# Patient Record
Sex: Female | Born: 1950 | Race: Black or African American | Hispanic: No | Marital: Single | Smoking: Current every day smoker
Health system: Southern US, Community
[De-identification: ages and names within clinical notes are randomized; demographics above are authoritative.]

## PROBLEM LIST (undated history)

## (undated) DIAGNOSIS — R000 Tachycardia, unspecified: Secondary | ICD-10-CM

## (undated) DIAGNOSIS — I10 Essential (primary) hypertension: Secondary | ICD-10-CM

## (undated) DIAGNOSIS — C50919 Malignant neoplasm of unspecified site of unspecified female breast: Secondary | ICD-10-CM

## (undated) DIAGNOSIS — E119 Type 2 diabetes mellitus without complications: Secondary | ICD-10-CM

## (undated) HISTORY — PX: OTHER SURGICAL HISTORY: SHX169

---

## 2009-01-15 ENCOUNTER — Ambulatory Visit: Payer: Self-pay | Admitting: Family Medicine

## 2010-04-27 ENCOUNTER — Emergency Department: Payer: Self-pay | Admitting: Emergency Medicine

## 2011-01-27 ENCOUNTER — Emergency Department: Payer: Self-pay | Admitting: Emergency Medicine

## 2011-02-17 ENCOUNTER — Emergency Department: Payer: Self-pay | Admitting: *Deleted

## 2011-03-18 ENCOUNTER — Emergency Department: Payer: Self-pay | Admitting: *Deleted

## 2011-03-21 ENCOUNTER — Other Ambulatory Visit: Payer: Self-pay | Admitting: General Practice

## 2012-05-04 ENCOUNTER — Emergency Department: Payer: Self-pay | Admitting: Emergency Medicine

## 2012-05-15 ENCOUNTER — Ambulatory Visit: Payer: Self-pay | Admitting: Orthopedic Surgery

## 2013-04-01 ENCOUNTER — Emergency Department: Payer: Self-pay

## 2013-04-01 LAB — CBC
HCT: 41.1 % (ref 35.0–47.0)
HGB: 14 g/dL (ref 12.0–16.0)
MCHC: 34.1 g/dL (ref 32.0–36.0)
Platelet: 230 10*3/uL (ref 150–440)
WBC: 8.8 10*3/uL (ref 3.6–11.0)

## 2013-04-01 LAB — COMPREHENSIVE METABOLIC PANEL
Albumin: 4.1 g/dL (ref 3.4–5.0)
Anion Gap: 7 (ref 7–16)
BUN: 8 mg/dL (ref 7–18)
Calcium, Total: 9.3 mg/dL (ref 8.5–10.1)
Chloride: 103 mmol/L (ref 98–107)
Creatinine: 0.73 mg/dL (ref 0.60–1.30)
EGFR (African American): >60
Osmolality: 270 (ref 275–301)
SGOT(AST): 47 U/L — ABNORMAL HIGH (ref 15–37)
Sodium: 134 mmol/L — ABNORMAL LOW (ref 136–145)
Total Protein: 7.7 g/dL (ref 6.4–8.2)

## 2013-04-01 LAB — TROPONIN I: Troponin-I: <0.02 ng/mL

## 2013-04-08 ENCOUNTER — Emergency Department: Payer: Self-pay | Admitting: Emergency Medicine

## 2013-12-29 ENCOUNTER — Emergency Department: Payer: Self-pay | Admitting: Emergency Medicine

## 2013-12-29 LAB — CBC WITH DIFFERENTIAL/PLATELET
BASOS ABS: 0.2 10*3/uL — AB (ref 0.0–0.1)
Basophil %: 1.1 %
EOS PCT: 2 %
Eosinophil #: 0.3 10*3/uL (ref 0.0–0.7)
HCT: 43.5 % (ref 35.0–47.0)
HGB: 13.8 g/dL (ref 12.0–16.0)
LYMPHS ABS: 2.4 10*3/uL (ref 1.0–3.6)
LYMPHS PCT: 16 %
MCH: 28.7 pg (ref 26.0–34.0)
MCHC: 31.7 g/dL — ABNORMAL LOW (ref 32.0–36.0)
MCV: 91 fL (ref 80–100)
Monocyte #: 1.1 x10 3/mm — ABNORMAL HIGH (ref 0.2–0.9)
Monocyte %: 7.5 %
NEUTROS ABS: 11.1 10*3/uL — AB (ref 1.4–6.5)
NEUTROS PCT: 73.4 %
PLATELETS: 217 10*3/uL (ref 150–440)
RBC: 4.8 10*6/uL (ref 3.80–5.20)
RDW: 15.8 % — ABNORMAL HIGH (ref 11.5–14.5)
WBC: 15.2 10*3/uL — AB (ref 3.6–11.0)

## 2013-12-29 LAB — COMPREHENSIVE METABOLIC PANEL
ALBUMIN: 3.5 g/dL (ref 3.4–5.0)
ALK PHOS: 50 U/L
ALT: 28 U/L
ANION GAP: 9 (ref 7–16)
AST: 28 U/L (ref 15–37)
BUN: 10 mg/dL (ref 7–18)
Bilirubin,Total: 0.4 mg/dL (ref 0.2–1.0)
CALCIUM: 8.2 mg/dL — AB (ref 8.5–10.1)
CHLORIDE: 109 mmol/L — AB (ref 98–107)
CREATININE: 0.8 mg/dL (ref 0.60–1.30)
Co2: 22 mmol/L (ref 21–32)
GLUCOSE: 177 mg/dL — AB (ref 65–99)
OSMOLALITY: 283 (ref 275–301)
Potassium: 3.8 mmol/L (ref 3.5–5.1)
Sodium: 140 mmol/L (ref 136–145)
Total Protein: 6.8 g/dL (ref 6.4–8.2)

## 2013-12-29 LAB — URINALYSIS, COMPLETE
Bilirubin,UR: NEGATIVE
Blood: NEGATIVE
Glucose,UR: 150 mg/dL (ref 0–75)
LEUKOCYTE ESTERASE: NEGATIVE
Nitrite: NEGATIVE
Ph: 5 (ref 4.5–8.0)
Protein: NEGATIVE
SPECIFIC GRAVITY: 1.025 (ref 1.003–1.030)
Squamous Epithelial: <1
WBC UR: 1 /HPF (ref 0–5)

## 2013-12-29 LAB — LIPASE, BLOOD: Lipase: 156 U/L (ref 73–393)

## 2018-08-14 ENCOUNTER — Emergency Department
Admission: EM | Admit: 2018-08-14 | Discharge: 2018-08-15 | Disposition: A | Discharge status: Home / Self Care | Payer: Medicare Other | Attending: Emergency Medicine | Admitting: Emergency Medicine

## 2018-08-14 ENCOUNTER — Other Ambulatory Visit: Payer: Self-pay

## 2018-08-14 ENCOUNTER — Emergency Department: Payer: Medicare Other

## 2018-08-14 DIAGNOSIS — E119 Type 2 diabetes mellitus without complications: Secondary | ICD-10-CM | POA: Diagnosis not present

## 2018-08-14 DIAGNOSIS — H5789 Other specified disorders of eye and adnexa: Secondary | ICD-10-CM | POA: Diagnosis not present

## 2018-08-14 DIAGNOSIS — R079 Chest pain, unspecified: Secondary | ICD-10-CM | POA: Diagnosis not present

## 2018-08-14 DIAGNOSIS — Z79899 Other long term (current) drug therapy: Secondary | ICD-10-CM | POA: Diagnosis not present

## 2018-08-14 DIAGNOSIS — R002 Palpitations: Secondary | ICD-10-CM

## 2018-08-14 HISTORY — DX: Type 2 diabetes mellitus without complications: E11.9

## 2018-08-14 HISTORY — DX: Tachycardia, unspecified: R00.0

## 2018-08-14 LAB — CBC
HEMATOCRIT: 46.1 % — AB (ref 36.0–46.0)
Hemoglobin: 15.3 g/dL — ABNORMAL HIGH (ref 12.0–15.0)
MCH: 28.8 pg (ref 26.0–34.0)
MCHC: 33.2 g/dL (ref 30.0–36.0)
MCV: 86.7 fL (ref 80.0–100.0)
Platelets: 234 10*3/uL (ref 150–400)
RBC: 5.32 MIL/uL — ABNORMAL HIGH (ref 3.87–5.11)
RDW: 14.7 % (ref 11.5–15.5)
WBC: 9.3 10*3/uL (ref 4.0–10.5)
nRBC: 0 % (ref 0.0–0.2)

## 2018-08-14 LAB — BASIC METABOLIC PANEL
Anion gap: 14 (ref 5–15)
BUN: 17 mg/dL (ref 8–23)
CHLORIDE: 103 mmol/L (ref 98–111)
CO2: 21 mmol/L — ABNORMAL LOW (ref 22–32)
CREATININE: 1.05 mg/dL — AB (ref 0.44–1.00)
Calcium: 10.1 mg/dL (ref 8.9–10.3)
GFR calc Af Amer: >60 mL/min (ref >60)
GFR calc non Af Amer: 56 mL/min — ABNORMAL LOW (ref >60)
Glucose, Bld: 294 mg/dL — ABNORMAL HIGH (ref 70–99)
Potassium: 4.1 mmol/L (ref 3.5–5.1)
Sodium: 138 mmol/L (ref 135–145)

## 2018-08-14 LAB — TROPONIN I: Troponin I: <0.03 ng/mL (ref <0.03)

## 2018-08-14 MED ORDER — SODIUM CHLORIDE 0.9 % IV SOLN: Freq: Once | INTRAVENOUS | Status: DC

## 2018-08-14 MED ORDER — LORAZEPAM 2 MG/ML IJ SOLN
0.5000 mg | Freq: Once | INTRAMUSCULAR | Status: AC
  Administered 2018-08-14: 0.5 mg via INTRAVENOUS
  Filled 2018-08-14: qty 1

## 2018-08-14 NOTE — ED Triage Notes (Signed)
Pt to ED via ems from home, pt states she started feeling palpations this morning. Pt initially did not have chest pain but began having chest pain while in the ambulance. Pt states it only lasted a minute and denies any pain, shortness of breath now. Pt has hx of ST and take metoprolol for it. NAD. VSS.

## 2018-08-14 NOTE — ED Provider Notes (Signed)
[facility] Emergency Department Provider Note       Time seen: ----------------------------------------- 10:02 PM on 08/14/2018 -----------------------------------------   I have reviewed the triage vital signs and the nursing notes.  HISTORY   Chief Complaint Chest Pain    HPI Alyssa Summers is a 68 y.o. female with a history of diabetes and tachycardia who presents to the ED for palpitations and chest pain.  Patient reports she has been having palpitations all day and she had some chest pain while in the ambulance on the way to the ER.  She reports taking her medications as she is prescribed.  She has had some elevated blood sugars.  She does report some itchy and watery eyes but no specific respiratory symptoms.  No past medical history on file.  There are no active problems to display for this patient.  Allergies Patient has no allergy information on record.  Social History Social History   Tobacco Use  . Smoking status: Not on file  Substance Use Topics  . Alcohol use: Not on file  . Drug use: Not on file   Review of Systems Constitutional: Negative for fever. Cardiovascular: Positive for palpitations, chest pain Respiratory: Negative for shortness of breath. Gastrointestinal: Negative for abdominal pain, vomiting and diarrhea. Skin: Negative for rash. Neurological: Negative for headaches, focal weakness or numbness.  All systems negative/normal/unremarkable except as stated in the HPI  ____________________________________________   PHYSICAL EXAM:  VITAL SIGNS: ED Triage Vitals [08/14/18 2201]  Enc Vitals Group     BP (!) 133/99     Pulse Rate 95     Resp      Temp      Temp src      SpO2 97 %     Weight      Height      Head Circumference      Peak Flow      Pain Score      Pain Loc      Pain Edu?      Excl. in [place]?    Constitutional: Alert and oriented.  Anxious, no distress Eyes: Conjunctivae are normal. Normal  extraocular movements. ENT      Head: Normocephalic and atraumatic.      Nose: No congestion/rhinnorhea.      Mouth/Throat: Mucous membranes are moist.      Neck: No stridor. Cardiovascular: Normal rate, regular rhythm. No murmurs, rubs, or gallops. Respiratory: Normal respiratory effort without tachypnea nor retractions. Breath sounds are clear and equal bilaterally. No wheezes/rales/rhonchi. Gastrointestinal: Soft and nontender. Normal bowel sounds Musculoskeletal: Nontender with normal range of motion in extremities. No lower extremity tenderness nor edema. Neurologic:  Normal speech and language. No gross focal neurologic deficits are appreciated.  Skin:  Skin is warm, dry and intact. No rash noted. Psychiatric: Mood and affect are normal. Speech and behavior are normal.  ____________________________________________  EKG: Interpreted by me.  Sinus rhythm the rate of 95 bpm, PVC, right bundle branch block, normal axis  ____________________________________________  ED COURSE:  As part of my medical decision making, I reviewed the following data within the [place] History obtained from family if available, nursing notes, old chart and ekg, as well as notes from prior ED visits. Patient presented for palpitations, we will assess with labs and imaging as indicated at this time.   Procedures Georgina Krist was evaluated in Emergency Department on 08/14/2018 for the symptoms described in the history of present illness. She was evaluated  in the context of the global COVID-19 pandemic, which necessitated consideration that the patient might be at risk for infection with the SARS-CoV-2 virus that causes COVID-19. Institutional protocols and algorithms that pertain to the evaluation of patients at risk for COVID-19 are in a state of rapid change based on information released by regulatory bodies including the CDC and federal and state organizations. These policies and algorithms  were followed during the patient's care in the ED.  ____________________________________________   LABS (pertinent positives/negatives)  Labs Reviewed  BASIC METABOLIC PANEL - Abnormal; Notable for the following components:      Result Value   CO2 21 (*)    Glucose, Bld 294 (*)    Creatinine, Ser 1.05 (*)    GFR calc non Af Amer 56 (*)    All other components within normal limits  CBC - Abnormal; Notable for the following components:   RBC 5.32 (*)    Hemoglobin 15.3 (*)    HCT 46.1 (*)    All other components within normal limits  TROPONIN I    RADIOLOGY  Chest x-ray IMPRESSION: Mild bronchial thickening which may be bronchitis, asthma, or smoking related lung disease. ____________________________________________   DIFFERENTIAL DIAGNOSIS   Palpitations,, arrhythmia, anxiety, tachycardia, medication side effect  FINAL ASSESSMENT AND PLAN  Palpitations, hyperglycemia   Plan: The patient had presented for palpitations. Patient's labs did indicate some hyperglycemia but no other significant acute process.  She was given IV fluids as well as IV Ativan patient's imaging was reassuring.  She is cleared for outpatient follow-up.   Laurence Aly, MD    Note: This note was generated in part or whole with voice recognition software. Voice recognition is usually quite accurate but there are transcription errors that can and very often do occur. I apologize for any typographical errors that were not detected and corrected.     Earleen Newport, MD 08/14/18 [phone]

## 2018-11-09 ENCOUNTER — Other Ambulatory Visit: Payer: Self-pay | Admitting: Family Medicine

## 2018-11-09 ENCOUNTER — Other Ambulatory Visit (HOSPITAL_COMMUNITY): Payer: Self-pay | Admitting: Family Medicine

## 2018-11-09 ENCOUNTER — Other Ambulatory Visit (HOSPITAL_COMMUNITY): Payer: Self-pay | Admitting: Physical Medicine and Rehabilitation

## 2018-11-09 ENCOUNTER — Other Ambulatory Visit: Payer: Self-pay

## 2018-11-09 ENCOUNTER — Ambulatory Visit
Admission: RE | Admit: 2018-11-09 | Discharge: 2018-11-09 | Disposition: A | Discharge status: Home / Self Care | Payer: Medicare Other | Source: Ambulatory Visit | Attending: Family Medicine | Admitting: Family Medicine

## 2018-11-09 DIAGNOSIS — M5416 Radiculopathy, lumbar region: Secondary | ICD-10-CM

## 2018-11-09 DIAGNOSIS — M5136 Other intervertebral disc degeneration, lumbar region: Secondary | ICD-10-CM

## 2018-11-25 ENCOUNTER — Emergency Department
Admission: EM | Admit: 2018-11-25 | Discharge: 2018-11-25 | Disposition: A | Discharge status: Home / Self Care | Payer: Medicare Other | Attending: Emergency Medicine | Admitting: Emergency Medicine

## 2018-11-25 ENCOUNTER — Other Ambulatory Visit: Payer: Self-pay

## 2018-11-25 DIAGNOSIS — M5432 Sciatica, left side: Secondary | ICD-10-CM | POA: Diagnosis not present

## 2018-11-25 DIAGNOSIS — F17210 Nicotine dependence, cigarettes, uncomplicated: Secondary | ICD-10-CM | POA: Diagnosis not present

## 2018-11-25 DIAGNOSIS — R200 Anesthesia of skin: Secondary | ICD-10-CM | POA: Diagnosis not present

## 2018-11-25 DIAGNOSIS — M549 Dorsalgia, unspecified: Secondary | ICD-10-CM | POA: Diagnosis present

## 2018-11-25 HISTORY — DX: Essential (primary) hypertension: I10

## 2018-11-25 MED ORDER — OXYCODONE HCL 5 MG PO TABS: 5.0000 mg | ORAL_TABLET | Freq: Four times a day (QID) | ORAL | 0 refills | Status: DC | PRN

## 2018-11-25 MED ORDER — OXYCODONE-ACETAMINOPHEN 5-325 MG PO TABS
2.0000 | ORAL_TABLET | Freq: Once | ORAL | Status: AC
  Administered 2018-11-25: 22:00:00 2 via ORAL
  Filled 2018-11-25: qty 2

## 2018-11-25 NOTE — ED Triage Notes (Signed)
Patient coming [facility] from home for back pain X 1 month. Patient has seen orthopedist, given oxycodone and steroids with no relief of pain. EMS vitals: 148/88, HR 110, CBG 186, 97% on RA.

## 2018-11-25 NOTE — ED Triage Notes (Addendum)
Pt comes via [facility] from home with c/o back pain. Pt states it has been ongoing for over a month and has not gotten better. Pt states it starts in her lower back and progresses to her left leg. Pt states she has had steroid injections last Friday with no relief.  Pt also states no relief with prescribed medications. Pt tearful in triage.  Pt states she was just sent to orthopedic MD today and nothing was done.

## 2018-11-25 NOTE — Discharge Instructions (Addendum)
Please follow-up with physical therapy, start muscle relaxer and increase gabapentin to 1 tab p.o. 3 times daily for the next 3 days then you can increase to 1 tab in the morning, 1 tab at noon and 2 tabs at night.  You may take oxycodone 5 mg every 6 hours as needed for severe pain but try to stick to 2 tablets daily as needed.  Continue with topical Voltaren gel.  Return to the ER for any severe increasing pain, worsening symptoms or urgent changes in your health.

## 2018-11-25 NOTE — ED Provider Notes (Addendum)
[facility] EMERGENCY DEPARTMENT Provider Note   CSN: [id number] Arrival date & time: 11/25/18  2003     History   Chief Complaint Chief Complaint  Patient presents with  . Back Pain    HPI Alyssa Summers is a 68 y.o. female presents to the emergency department for evaluation of severe back pain rating down her left leg.  Symptoms are constant, increased with standing.  Symptoms been ongoing for greater than 1 month.  She has had a recent MRI lumbar spine showing possible left L4 nerve impingement followed by lumbar ESI injections with no improvement.  She saw neurosurgery today and was recommended to start outpatient PT.  He added a muscle laxer which patient has not taken yet.  Neurosurgery did not feels that patient was a candidate for surgery at this time.  Physiatry has been prescribing patient gabapentin and oxycodone.  She is currently taking gabapentin 2 tabs daily along with oxycodone 5 mg twice daily.  She states his medications are not helping with her pain.  Patient denies any loss of bowel or bladder symptoms.  No fevers.  She is ambulatory with a cane.  No new trauma or injury.     HPI  Past Medical History:  Diagnosis Date  . Diabetes mellitus without complication ([place])   . Hypertension   . Tachycardia     There are no active problems to display for this patient.   History reviewed. No pertinent surgical history.   OB History   No obstetric history on file.      Home Medications    Prior to Admission medications   Medication Sig Start Date End Date Taking? Authorizing Provider  oxyCODONE (ROXICODONE) 5 MG immediate release tablet Take 1 tablet (5 mg total) by mouth every 6 (six) hours as needed. 11/25/18 11/25/19  Duanne Guess, PA-C    Family History No family history on file.  Social History Social History   Tobacco Use  . Smoking status: Current Every Day Smoker    Packs/day: 0.50    Types: Cigarettes  . Smokeless tobacco:  Never Used  Substance Use Topics  . Alcohol use: Yes  . Drug use: Never     Allergies   Glipizide   Review of Systems Review of Systems  Constitutional: Negative for activity change and fever.  Eyes: Negative for pain and visual disturbance.  Respiratory: Negative for shortness of breath.   Cardiovascular: Negative for chest pain and leg swelling.  Gastrointestinal: Negative for abdominal pain.  Genitourinary: Negative for flank pain and pelvic pain.  Musculoskeletal: Positive for back pain. Negative for arthralgias, gait problem, joint swelling, myalgias, neck pain and neck stiffness.  Skin: Negative for wound.  Neurological: Positive for numbness. Negative for dizziness, syncope, weakness, light-headedness and headaches.  Psychiatric/Behavioral: Negative for confusion and decreased concentration.     Physical Exam Updated Vital Signs BP 116/80   Pulse (!) 110   Temp 100.2 F (37.9 C) (Oral)   Resp 18   Ht 5\' 8"  (1.727 m)   Wt 77.6 kg   SpO2 98%   BMI 26.00 kg/m   Physical Exam Constitutional:      Appearance: She is well-developed.  HENT:     Head: Normocephalic and atraumatic.     Right Ear: External ear normal.     Left Ear: External ear normal.     Nose: Nose normal.  Eyes:     Conjunctiva/sclera: Conjunctivae normal.     Pupils: Pupils are  equal, round, and reactive to light.  Neck:     Musculoskeletal: Normal range of motion.  Cardiovascular:     Rate and Rhythm: Normal rate.  Pulmonary:     Effort: Pulmonary effort is normal. No respiratory distress.     Breath sounds: Normal breath sounds.  Abdominal:     Palpations: Abdomen is soft.     Tenderness: There is no abdominal tenderness.  Musculoskeletal: Normal range of motion.        General: No deformity.     Comments: Examination lumbar spine shows no spinous process tenderness.  Mild left paravertebral muscle tenderness.  She is nontender along the sacrum or SI joints.  No CVA tenderness  bilaterally.  She has excellent range of motion of both hips with no discomfort and no pain with range of motion.  Patient has normal sensation throughout both lower extremities with no swelling or edema.  No weakness with knee extension, flexion, ankle plantar flexion dorsiflexion.  No clonus noted.  Patellar reflexes are normal bilaterally  Skin:    General: Skin is warm and dry.     Findings: No rash.  Neurological:     Mental Status: She is alert and oriented to person, place, and time.     Cranial Nerves: No cranial nerve deficit.     Coordination: Coordination normal.  Psychiatric:        Behavior: Behavior normal.      ED Treatments / Results  Labs (all labs ordered are listed, but only abnormal results are displayed) Labs Reviewed - No data to display  EKG None  Radiology No results found.   Imaging: MRI L spine 11/09/2018  L3-L4: Mild disc bulging with superimposed tiny right paracentral disc extrusion and annular fissure with slight superior migration. Mild bilateral facet arthropathy with 6 x 4 x 10 mm left-sided synovial cyst. Mild left lateral recess stenosis. No spinal canal or neuroforaminal stenosis.  L4-L5: Tiny shallow broad-based left subarticular and foraminal disc protrusion. No stenosis.  L5-S1: Negative.  IMPRESSION: 1. Mild lumbar spondylosis as described above. Small left-sided synovial cyst at L3-L4 with mild left lateral recess stenosis potentially affecting the descending left L4 nerve root.  Electronically Signed By: Titus Dubin M.D. On: 11/09/2018 14:00  Procedures Procedures (including critical care time)  Medications Ordered in ED Medications  oxyCODONE-acetaminophen (PERCOCET/ROXICET) 5-325 MG per tablet 2 tablet (has no administration in time range)     Initial Impression / Assessment and Plan / ED Course  I have reviewed the triage vital signs and the nursing notes.  Pertinent labs & imaging results that were available  during my care of the patient were reviewed by me and considered in my medical decision making (see chart for details).        68 year old female with low back pain with left lumbar radiculopathy.  No neurological deficits.  Pain moderate to severe.  Encourage patient to start taking muscle relaxer that was prescribed today along with increasing her gabapentin to 3 tabs daily for the next 3 days then 4 tabs daily.  She can also increase her oxycodone to 1 tab p.o. every 6 hours, new prescription was given today.  She is encouraged to try to take oxycodone only as needed for severe pain.  Patient is currently under care of neurosurgery and physiatry and will be following up with them as needed.  She understands signs and symptoms return to ED for.  Vital signs are stable, afebrile.  Final Clinical Impressions(s) /  ED Diagnoses   Final diagnoses:  Sciatica of left side    ED Discharge Orders         Ordered    oxyCODONE (ROXICODONE) 5 MG immediate release tablet  Every 6 hours PRN     11/25/18 2157           Duanne Guess, PA-C 11/25/18 2209    Duanne Guess, PA-C 11/25/18 2209    Duffy Bruce, MD 11/28/18 0530

## 2018-12-07 ENCOUNTER — Emergency Department
Admission: EM | Admit: 2018-12-07 | Discharge: 2018-12-07 | Disposition: A | Discharge status: Home / Self Care | Payer: Medicare Other | Attending: Emergency Medicine | Admitting: Emergency Medicine

## 2018-12-07 ENCOUNTER — Encounter: Payer: Self-pay | Admitting: Emergency Medicine

## 2018-12-07 ENCOUNTER — Other Ambulatory Visit: Payer: Self-pay

## 2018-12-07 DIAGNOSIS — I10 Essential (primary) hypertension: Secondary | ICD-10-CM | POA: Insufficient documentation

## 2018-12-07 DIAGNOSIS — F17210 Nicotine dependence, cigarettes, uncomplicated: Secondary | ICD-10-CM | POA: Insufficient documentation

## 2018-12-07 DIAGNOSIS — T162XXA Foreign body in left ear, initial encounter: Secondary | ICD-10-CM | POA: Diagnosis present

## 2018-12-07 DIAGNOSIS — X58XXXA Exposure to other specified factors, initial encounter: Secondary | ICD-10-CM | POA: Insufficient documentation

## 2018-12-07 DIAGNOSIS — Y939 Activity, unspecified: Secondary | ICD-10-CM | POA: Insufficient documentation

## 2018-12-07 DIAGNOSIS — Y929 Unspecified place or not applicable: Secondary | ICD-10-CM | POA: Diagnosis not present

## 2018-12-07 DIAGNOSIS — E119 Type 2 diabetes mellitus without complications: Secondary | ICD-10-CM | POA: Diagnosis not present

## 2018-12-07 DIAGNOSIS — Y999 Unspecified external cause status: Secondary | ICD-10-CM | POA: Insufficient documentation

## 2018-12-07 DIAGNOSIS — Z1889 Other specified retained foreign body fragments: Secondary | ICD-10-CM | POA: Insufficient documentation

## 2018-12-07 MED ORDER — CIPROFLOXACIN-DEXAMETHASONE 0.3-0.1 % OT SUSP
4.0000 [drp] | Freq: Once | OTIC | Status: AC
  Administered 2018-12-07: 4 [drp] via OTIC
  Filled 2018-12-07: qty 7.5

## 2018-12-07 NOTE — ED Provider Notes (Signed)
[facility] Emergency Department Provider Note  ____________________________________________  Time seen: Approximately 10:44 PM  I have reviewed the triage vital signs and the nursing notes.   HISTORY  Chief Complaint Foreign Body in Ear    HPI Alyssa Summers is a 68 y.o. female presents to the emergency department for evaluation of bug to left ear tonight.  She estimates the bug flew into her ear while she was outside around 8:30 PM tonight.  She can feel it crawling prior to coming to the emergency department but believes now that it is dead.   Past Medical History:  Diagnosis Date  . Diabetes mellitus without complication ([place])   . Hypertension   . Tachycardia     There are no active problems to display for this patient.   History reviewed. No pertinent surgical history.  Prior to Admission medications   Medication Sig Start Date End Date Taking? Authorizing Provider  oxyCODONE (ROXICODONE) 5 MG immediate release tablet Take 1 tablet (5 mg total) by mouth every 6 (six) hours as needed. 11/25/18 11/25/19  Duanne Guess, PA-C    Allergies Glipizide  No family history on file.  Social History Social History   Tobacco Use  . Smoking status: Current Every Day Smoker    Packs/day: 0.50    Types: Cigarettes  . Smokeless tobacco: Never Used  Substance Use Topics  . Alcohol use: Yes  . Drug use: Never     Review of Systems  Constitutional: No fever/chills Gastrointestinal: No nausea, no vomiting.  Musculoskeletal: Negative for musculoskeletal pain. Skin: Negative for rash, abrasions, lacerations, ecchymosis. Neurological: Negative for headaches   ____________________________________________   PHYSICAL EXAM:  VITAL SIGNS: ED Triage Vitals  Enc Vitals Group     BP 12/07/18 2111 134/73     Pulse Rate 12/07/18 2111 89     Resp 12/07/18 2111 16     Temp 12/07/18 2111 97.9 F (36.6 C)     Temp Source 12/07/18 2111 Oral     SpO2  12/07/18 2111 99 %     Weight 12/07/18 2109 171 lb (77.6 kg)     Height 12/07/18 2109 5\' 8"  (1.727 m)     Head Circumference --      Peak Flow --      Pain Score 12/07/18 2108 8     Pain Loc --      Pain Edu? --      Excl. in [place]? --      Constitutional: Alert and oriented. Well appearing and in no acute distress. Eyes: Conjunctivae are normal. PERRL. EOMI. Head: Atraumatic. ENT:      Ears: Insect to left ear canal.      Nose: No congestion/rhinnorhea.      Mouth/Throat: Mucous membranes are moist.  Neck: No stridor.   Cardiovascular: Normal rate, regular rhythm.  Good peripheral circulation. Respiratory: Normal respiratory effort without tachypnea or retractions. Lungs CTAB. Good air entry to the bases with no decreased or absent breath sounds. Musculoskeletal: Full range of motion to all extremities. No gross deformities appreciated. Neurologic:  Normal speech and language. No gross focal neurologic deficits are appreciated.  Skin:  Skin is warm, dry and intact. No rash noted. Psychiatric: Mood and affect are normal. Speech and behavior are normal. Patient exhibits appropriate insight and judgement.   ____________________________________________   LABS (all labs ordered are listed, but only abnormal results are displayed)  Labs Reviewed - No data to display ____________________________________________  EKG   ____________________________________________  RADIOLOGY   No results found.  ____________________________________________    PROCEDURES  Procedure(s) performed:    .Foreign Body Removal  Date/Time: 12/07/2018 10:46 PM Performed by: Laban Emperor, PA-C Authorized by: Laban Emperor, PA-C  Consent: Verbal consent obtained. Risks and benefits: risks, benefits and alternatives were discussed Consent given by: parent Body area: ear Location details: left ear Localization method: visualized and ENT speculum Removal mechanism: forceps, ear scoop and  curette Complexity: simple 1 objects recovered. Objects recovered: hard shelled thorax of insect Post-procedure assessment: foreign body removed Patient tolerance: patient tolerated the procedure well with no immediate complications      Medications  ciprofloxacin-dexamethasone (CIPRODEX) 0.3-0.1 % OTIC (EAR) suspension 4 drop (4 drops Left EAR Given 12/07/18 2308)     ____________________________________________   INITIAL IMPRESSION / ASSESSMENT AND PLAN / ED COURSE  Pertinent labs & imaging results that were available during my care of the patient were reviewed by me and considered in my medical decision making (see chart for details).  Review of the [place] CSRS was performed in accordance of the [place] prior to dispensing any controlled drugs.   Patient presented to emergency department for evaluation of bug to left ear today.  Vital signs and exam are reassuring.  Outer shell of thorax of some sort of beetle was removed in the emergency department.  No additional insect was seen in the emergency department.  I suspect that bug tried to crawl out of ear and shell of thorax got stuck to ear canal.  Ciprodex was given in the emergency department.  Patient is to follow up with ENT as directed. Patient is given ED precautions to return to the ED for any worsening or new symptoms.  Alyssa Summers was evaluated in Emergency Department on 12/07/2018 for the symptoms described in the history of present illness. She was evaluated in the context of the global COVID-19 pandemic, which necessitated consideration that the patient might be at risk for infection with the SARS-CoV-2 virus that causes COVID-19. Institutional protocols and algorithms that pertain to the evaluation of patients at risk for COVID-19 are in a state of rapid change based on information released by regulatory bodies including the CDC and federal and state organizations. These policies and algorithms were followed during the patient's  care in the ED.   ____________________________________________  FINAL CLINICAL IMPRESSION(S) / ED DIAGNOSES  Final diagnoses:  Foreign body of left ear, initial encounter      NEW MEDICATIONS STARTED DURING THIS VISIT:  ED Discharge Orders    None          This chart was dictated using voice recognition software/Dragon. Despite best efforts to proofread, errors can occur which can change the meaning. Any change was purely unintentional.    Laban Emperor, PA-C 12/07/18 2317    Arta Silence, MD 12/07/18 [phone]

## 2018-12-07 NOTE — ED Triage Notes (Signed)
Pt states about an hour ago and bug got int her left ear and it keeps moving around and aggravating her.

## 2019-01-28 ENCOUNTER — Observation Stay: Payer: Medicare Other

## 2019-01-28 ENCOUNTER — Emergency Department: Payer: Medicare Other

## 2019-01-28 ENCOUNTER — Observation Stay (HOSPITAL_BASED_OUTPATIENT_CLINIC_OR_DEPARTMENT_OTHER)
Admit: 2019-01-28 | Discharge: 2019-01-28 | Disposition: A | Discharge status: Home / Self Care | Payer: Medicare Other | Attending: Internal Medicine | Admitting: Internal Medicine

## 2019-01-28 ENCOUNTER — Observation Stay
Admission: EM | Admit: 2019-01-28 | Discharge: 2019-01-29 | Disposition: A | Discharge status: Home / Self Care | Payer: Medicare Other | Attending: Internal Medicine | Admitting: Internal Medicine

## 2019-01-28 ENCOUNTER — Encounter: Payer: Self-pay | Admitting: Emergency Medicine

## 2019-01-28 ENCOUNTER — Other Ambulatory Visit: Payer: Medicare Other

## 2019-01-28 ENCOUNTER — Other Ambulatory Visit: Payer: Self-pay

## 2019-01-28 DIAGNOSIS — Z7984 Long term (current) use of oral hypoglycemic drugs: Secondary | ICD-10-CM | POA: Insufficient documentation

## 2019-01-28 DIAGNOSIS — G459 Transient cerebral ischemic attack, unspecified: Principal | ICD-10-CM | POA: Diagnosis present

## 2019-01-28 DIAGNOSIS — R404 Transient alteration of awareness: Secondary | ICD-10-CM

## 2019-01-28 DIAGNOSIS — I10 Essential (primary) hypertension: Secondary | ICD-10-CM | POA: Insufficient documentation

## 2019-01-28 DIAGNOSIS — Z7982 Long term (current) use of aspirin: Secondary | ICD-10-CM | POA: Diagnosis not present

## 2019-01-28 DIAGNOSIS — Z20828 Contact with and (suspected) exposure to other viral communicable diseases: Secondary | ICD-10-CM | POA: Insufficient documentation

## 2019-01-28 DIAGNOSIS — F17210 Nicotine dependence, cigarettes, uncomplicated: Secondary | ICD-10-CM | POA: Diagnosis not present

## 2019-01-28 DIAGNOSIS — E1142 Type 2 diabetes mellitus with diabetic polyneuropathy: Secondary | ICD-10-CM | POA: Insufficient documentation

## 2019-01-28 DIAGNOSIS — R4182 Altered mental status, unspecified: Secondary | ICD-10-CM | POA: Diagnosis not present

## 2019-01-28 DIAGNOSIS — R41841 Cognitive communication deficit: Secondary | ICD-10-CM | POA: Insufficient documentation

## 2019-01-28 DIAGNOSIS — I639 Cerebral infarction, unspecified: Secondary | ICD-10-CM

## 2019-01-28 DIAGNOSIS — Z79899 Other long term (current) drug therapy: Secondary | ICD-10-CM | POA: Diagnosis not present

## 2019-01-28 LAB — URINALYSIS, COMPLETE (UACMP) WITH MICROSCOPIC
Bilirubin Urine: NEGATIVE
Glucose, UA: >=500 mg/dL — AB
Hgb urine dipstick: NEGATIVE
Ketones, ur: 20 mg/dL — AB
Leukocytes,Ua: NEGATIVE
Nitrite: NEGATIVE
Protein, ur: NEGATIVE mg/dL
Specific Gravity, Urine: 1.028 (ref 1.005–1.030)
pH: 5 (ref 5.0–8.0)

## 2019-01-28 LAB — CBC WITH DIFFERENTIAL/PLATELET
Abs Immature Granulocytes: 0.03 10*3/uL (ref 0.00–0.07)
Basophils Absolute: 0.1 10*3/uL (ref 0.0–0.1)
Basophils Relative: 2 %
Eosinophils Absolute: 0.2 10*3/uL (ref 0.0–0.5)
Eosinophils Relative: 2 %
HCT: 47.8 % — ABNORMAL HIGH (ref 36.0–46.0)
Hemoglobin: 16 g/dL — ABNORMAL HIGH (ref 12.0–15.0)
Immature Granulocytes: 0 %
Lymphocytes Relative: 28 %
Lymphs Abs: 2.2 10*3/uL (ref 0.7–4.0)
MCH: 28.9 pg (ref 26.0–34.0)
MCHC: 33.5 g/dL (ref 30.0–36.0)
MCV: 86.3 fL (ref 80.0–100.0)
Monocytes Absolute: 0.8 10*3/uL (ref 0.1–1.0)
Monocytes Relative: 11 %
Neutro Abs: 4.6 10*3/uL (ref 1.7–7.7)
Neutrophils Relative %: 57 %
Platelets: 256 10*3/uL (ref 150–400)
RBC: 5.54 MIL/uL — ABNORMAL HIGH (ref 3.87–5.11)
RDW: 14.6 % (ref 11.5–15.5)
WBC: 7.9 10*3/uL (ref 4.0–10.5)
nRBC: 0 % (ref 0.0–0.2)

## 2019-01-28 LAB — COMPREHENSIVE METABOLIC PANEL
ALT: 26 U/L (ref 0–44)
AST: 21 U/L (ref 15–41)
Albumin: 4.5 g/dL (ref 3.5–5.0)
Alkaline Phosphatase: 58 U/L (ref 38–126)
Anion gap: 12 (ref 5–15)
BUN: 9 mg/dL (ref 8–23)
CO2: 23 mmol/L (ref 22–32)
Calcium: 9.7 mg/dL (ref 8.9–10.3)
Chloride: 102 mmol/L (ref 98–111)
Creatinine, Ser: 0.69 mg/dL (ref 0.44–1.00)
GFR calc Af Amer: >60 mL/min (ref >60)
GFR calc non Af Amer: >60 mL/min (ref >60)
Glucose, Bld: 157 mg/dL — ABNORMAL HIGH (ref 70–99)
Potassium: 4 mmol/L (ref 3.5–5.1)
Sodium: 137 mmol/L (ref 135–145)
Total Bilirubin: 1.1 mg/dL (ref 0.3–1.2)
Total Protein: 7.6 g/dL (ref 6.5–8.1)

## 2019-01-28 LAB — GLUCOSE, CAPILLARY
Glucose-Capillary: 182 mg/dL — ABNORMAL HIGH (ref 70–99)
Glucose-Capillary: 219 mg/dL — ABNORMAL HIGH (ref 70–99)

## 2019-01-28 LAB — LIPASE, BLOOD: Lipase: 29 U/L (ref 11–51)

## 2019-01-28 MED ORDER — INSULIN ASPART 100 UNIT/ML ~~LOC~~ SOLN
0.0000 [IU] | Freq: Three times a day (TID) | SUBCUTANEOUS | Status: DC
  Administered 2019-01-28 – 2019-01-29 (×3): 2 [IU] via SUBCUTANEOUS
  Filled 2019-01-28 (×3): qty 1

## 2019-01-28 MED ORDER — GABAPENTIN 300 MG PO CAPS
600.0000 mg | ORAL_CAPSULE | Freq: Every day | ORAL | Status: DC
  Administered 2019-01-28: 23:00:00 600 mg via ORAL
  Filled 2019-01-28: qty 2

## 2019-01-28 MED ORDER — EMPAGLIFLOZIN 25 MG PO TABS
25.0000 mg | ORAL_TABLET | Freq: Every day | ORAL | Status: DC
  Filled 2019-01-28: qty 25

## 2019-01-28 MED ORDER — ENOXAPARIN SODIUM 40 MG/0.4ML ~~LOC~~ SOLN
40.0000 mg | SUBCUTANEOUS | Status: DC
  Administered 2019-01-28: 23:00:00 40 mg via SUBCUTANEOUS
  Filled 2019-01-28: qty 0.4

## 2019-01-28 MED ORDER — ASPIRIN 81 MG PO CHEW
324.0000 mg | CHEWABLE_TABLET | Freq: Once | ORAL | Status: AC
  Administered 2019-01-28: 324 mg via ORAL
  Filled 2019-01-28: qty 4

## 2019-01-28 MED ORDER — ASPIRIN EC 81 MG PO TBEC
81.0000 mg | DELAYED_RELEASE_TABLET | Freq: Every day | ORAL | Status: DC
  Administered 2019-01-29: 81 mg via ORAL
  Filled 2019-01-28: qty 1

## 2019-01-28 MED ORDER — GABAPENTIN 300 MG PO CAPS
300.0000 mg | ORAL_CAPSULE | Freq: Two times a day (BID) | ORAL | Status: DC
  Administered 2019-01-28 – 2019-01-29 (×3): 300 mg via ORAL
  Filled 2019-01-28 (×3): qty 1

## 2019-01-28 MED ORDER — PRAVASTATIN SODIUM 40 MG PO TABS
40.0000 mg | ORAL_TABLET | Freq: Every day | ORAL | Status: DC
  Administered 2019-01-28: 18:00:00 40 mg via ORAL
  Filled 2019-01-28: qty 1
  Filled 2019-01-28: qty 2
  Filled 2019-01-28: qty 1

## 2019-01-28 MED ORDER — MONTELUKAST SODIUM 10 MG PO TABS
10.0000 mg | ORAL_TABLET | Freq: Every day | ORAL | Status: DC
  Administered 2019-01-28 – 2019-01-29 (×2): 10 mg via ORAL
  Filled 2019-01-28 (×3): qty 1

## 2019-01-28 MED ORDER — METOPROLOL SUCCINATE ER 50 MG PO TB24
50.0000 mg | ORAL_TABLET | Freq: Every day | ORAL | Status: DC
  Administered 2019-01-28 – 2019-01-29 (×2): 50 mg via ORAL
  Filled 2019-01-28 (×2): qty 1

## 2019-01-28 MED ORDER — GABAPENTIN 300 MG PO CAPS: 300.0000 mg | ORAL_CAPSULE | ORAL | Status: DC

## 2019-01-28 MED ORDER — STROKE: EARLY STAGES OF RECOVERY BOOK
Freq: Once | Status: AC
  Administered 2019-01-28: 13:00:00

## 2019-01-28 MED ORDER — METFORMIN HCL ER 500 MG PO TB24
1000.0000 mg | ORAL_TABLET | Freq: Two times a day (BID) | ORAL | Status: DC
  Administered 2019-01-28 – 2019-01-29 (×2): 1000 mg via ORAL
  Filled 2019-01-28 (×3): qty 2

## 2019-01-28 MED ORDER — ACETAMINOPHEN 160 MG/5ML PO SOLN
650.0000 mg | ORAL | Status: DC | PRN
  Filled 2019-01-28: qty 20.3

## 2019-01-28 MED ORDER — INSULIN ASPART 100 UNIT/ML ~~LOC~~ SOLN
0.0000 [IU] | Freq: Every day | SUBCUTANEOUS | Status: DC
  Administered 2019-01-28: 23:00:00 2 [IU] via SUBCUTANEOUS
  Filled 2019-01-28: qty 1

## 2019-01-28 MED ORDER — IOHEXOL 350 MG/ML SOLN
75.0000 mL | Freq: Once | INTRAVENOUS | Status: AC | PRN
  Administered 2019-01-28: 75 mL via INTRAVENOUS

## 2019-01-28 MED ORDER — ACETAMINOPHEN 325 MG PO TABS: 650.0000 mg | ORAL_TABLET | ORAL | Status: DC | PRN

## 2019-01-28 MED ORDER — ACETAMINOPHEN 650 MG RE SUPP: 650.0000 mg | RECTAL | Status: DC | PRN

## 2019-01-28 MED ORDER — AMLODIPINE BESYLATE 5 MG PO TABS
5.0000 mg | ORAL_TABLET | Freq: Every day | ORAL | Status: DC
  Administered 2019-01-28 – 2019-01-29 (×2): 5 mg via ORAL
  Filled 2019-01-28 (×2): qty 1

## 2019-01-28 NOTE — ED Notes (Signed)
Attempted to call report. Placed on hold again. Informed secretary pt was being taken to MRI and would be up in 20 minutes. Gave name and number if nurse had any questions to call back.

## 2019-01-28 NOTE — ED Triage Notes (Signed)
Pt from home via EMS due to increased confusion since Monday that is altered from baseline. Sister suggested pt call due to sister's experience with previous stroke.

## 2019-01-28 NOTE — H&P (Signed)
[place] [place] NAME: Alyssa Summers    MR#:  [id number]  DATE OF BIRTH:  November 26, 1950  DATE OF ADMISSION:  01/28/2019  PRIMARY CARE PHYSICIAN: Kirk Ruths, MD   REQUESTING/REFERRING PHYSICIAN: Fonnie Birkenhead, MD  CHIEF COMPLAINT:   Chief Complaint  Patient presents with  . Altered Mental Status    HISTORY OF PRESENT ILLNESS: Alyssa Summers  is a 68 y.o. female with a known history of diabetes type 2 hypertension who presents with complaints of some memory trouble.  Also having trouble with speech.  She states that she forgets what she wants to say.  She otherwise denies any weakness.  CT scan of the head was negative in the emergency room.     PAST MEDICAL HISTORY:   Past Medical History:  Diagnosis Date  . Diabetes mellitus without complication ([place])   . Hypertension   . Tachycardia     PAST SURGICAL HISTORY:  Past Surgical History:  Procedure Laterality Date  . lap choleycystecomy      SOCIAL HISTORY:  Social History   Tobacco Use  . Smoking status: Current Every Day Smoker    Packs/day: 0.50    Types: Cigarettes  . Smokeless tobacco: Never Used  Substance Use Topics  . Alcohol use: Yes    FAMILY HISTORY:  Family History  Problem Relation Age of Onset  . Hypertension Mother     DRUG ALLERGIES:  Allergies  Allergen Reactions  . Glipizide     REVIEW OF SYSTEMS:   CONSTITUTIONAL: No fever, fatigue or weakness.  EYES: No blurred or double vision.  EARS, NOSE, AND THROAT: No tinnitus or ear pain.  RESPIRATORY: No cough, shortness of breath, wheezing or hemoptysis.  CARDIOVASCULAR: No chest pain, orthopnea, edema.  GASTROINTESTINAL: No nausea, vomiting, diarrhea or abdominal pain.  GENITOURINARY: No dysuria, hematuria.  ENDOCRINE: No polyuria, nocturia,  HEMATOLOGY: No anemia, easy bruising or bleeding SKIN: No rash or lesion. MUSCULOSKELETAL: No joint pain or arthritis.   NEUROLOGIC: memory trouble  as above  PSYCHIATRY: No anxiety or depression.   MEDICATIONS AT HOME:  Prior to Admission medications   Medication Sig Start Date End Date Taking? Authorizing Provider  amLODipine (NORVASC) 5 MG tablet Take 5 mg by mouth daily. 11/19/18  Yes [provider]  aspirin EC 81 MG tablet Take 81 mg by mouth daily. 09/23/18  Yes [provider]  gabapentin (NEURONTIN) 300 MG capsule Take 300-600 mg by mouth See admin instructions. Take 1 capsule (300mg ) by mouth every morning and daily at lunchtime then take 2 capsules (600mg ) by mouth every night at bedtime 12/01/18  Yes [provider]  JARDIANCE 25 MG TABS tablet Take 25 mg by mouth daily. 10/25/18  Yes [provider]  lovastatin (MEVACOR) 40 MG tablet Take 40 mg by mouth daily after supper. 09/23/18  Yes [provider]  metFORMIN (GLUCOPHAGE-XR) 500 MG 24 hr tablet Take 1,000 mg by mouth 2 (two) times daily. 11/16/18  Yes [provider]  metoprolol succinate (TOPROL-XL) 50 MG 24 hr tablet Take 50 mg by mouth daily. 09/24/18  Yes [provider]  montelukast (SINGULAIR) 10 MG tablet Take 10 mg by mouth daily. 10/22/18  Yes [provider]  oxyCODONE (ROXICODONE) 5 MG immediate release tablet Take 1 tablet (5 mg total) by mouth every 6 (six) hours as needed. Patient not taking: Reported on 01/28/2019 11/25/18 11/25/19  Duanne Guess, PA-C      PHYSICAL  EXAMINATION:   VITAL SIGNS: Blood pressure 129/77, pulse 73, temperature 97.7 F (36.5 C), temperature source Oral, resp. rate 18, height 5\' 8"  (1.727 m), weight 73 kg, SpO2 100 %.  GENERAL:  68 y.o.-year-old patient lying in the bed with no acute distress.  EYES: Pupils equal, round, reactive to light and accommodation. No scleral icterus. Extraocular muscles intact.  HEENT: Head atraumatic, normocephalic. Oropharynx and nasopharynx clear.  NECK:  Supple, no jugular venous distention. No thyroid enlargement, no tenderness.  LUNGS:  Normal breath sounds bilaterally, no wheezing, rales,rhonchi or crepitation. No use of accessory muscles of respiration.  CARDIOVASCULAR: S1, S2 normal. No murmurs, rubs, or gallops.  ABDOMEN: Soft, nontender, nondistended. Bowel sounds present. No organomegaly or mass.  EXTREMITIES: No pedal edema, cyanosis, or clubbing.  NEUROLOGIC: Cranial nerves II through XII are intact. Muscle strength 5/5 in all extremities. Sensation intact. Gait not checked.  PSYCHIATRIC: The patient is alert and oriented x 3.  SKIN: No obvious rash, lesion, or ulcer.   LABORATORY PANEL:   CBC Recent Labs  Lab 01/28/19 0952  WBC 7.9  HGB 16.0*  HCT 47.8*  PLT 256  MCV 86.3  MCH 28.9  MCHC 33.5  RDW 14.6  LYMPHSABS 2.2  MONOABS 0.8  EOSABS 0.2  BASOSABS 0.1   ------------------------------------------------------------------------------------------------------------------  Chemistries  Recent Labs  Lab 01/28/19 0952  NA 137  K 4.0  CL 102  CO2 23  GLUCOSE 157*  BUN 9  CREATININE 0.69  CALCIUM 9.7  AST 21  ALT 26  ALKPHOS 58  BILITOT 1.1   ------------------------------------------------------------------------------------------------------------------ estimated creatinine clearance is 69.8 mL/min (by C-G formula based on SCr of 0.69 mg/dL). ------------------------------------------------------------------------------------------------------------------ No results for input(s): TSH, T4TOTAL, T3FREE, THYROIDAB in the last 72 hours.  Invalid input(s): FREET3   Coagulation profile No results for input(s): INR, PROTIME in the last 168 hours. ------------------------------------------------------------------------------------------------------------------- No results for input(s): DDIMER in the last 72 hours. -------------------------------------------------------------------------------------------------------------------  Cardiac Enzymes No results for input(s): CKMB, TROPONINI,  MYOGLOBIN in the last 168 hours.  Invalid input(s): CK ------------------------------------------------------------------------------------------------------------------ Invalid input(s): POCBNP  ---------------------------------------------------------------------------------------------------------------  Urinalysis    Component Value Date/Time   COLORURINE YELLOW (A) 01/28/2019 0952   APPEARANCEUR CLEAR (A) 01/28/2019 0952   APPEARANCEUR Clear 12/29/2013 1419   LABSPEC 1.028 01/28/2019 0952   LABSPEC 1.025 12/29/2013 1419   PHURINE 5.0 01/28/2019 0952   GLUCOSEU >=500 (A) 01/28/2019 0952   GLUCOSEU 150 mg/dL 12/29/2013 1419   HGBUR NEGATIVE 01/28/2019 0952   BILIRUBINUR NEGATIVE 01/28/2019 0952   BILIRUBINUR Negative 12/29/2013 1419   KETONESUR 20 (A) 01/28/2019 0952   PROTEINUR NEGATIVE 01/28/2019 0952   NITRITE NEGATIVE 01/28/2019 0952   LEUKOCYTESUR NEGATIVE 01/28/2019 0952   LEUKOCYTESUR Negative 12/29/2013 1419     RADIOLOGY: Dg Chest 2 View  Result Date: 01/28/2019 CLINICAL DATA:  Altered mental status. EXAM: CHEST - 2 VIEW COMPARISON:  08/14/2018 FINDINGS: The heart size and mediastinal contours are within normal limits. Both lungs are clear. The visualized skeletal structures are unremarkable. IMPRESSION: No active cardiopulmonary disease. Electronically Signed   By: Marlaine Hind M.D.   On: 01/28/2019 09:34   Ct Head Wo Contrast  Result Date: 01/28/2019 CLINICAL DATA:  Altered level of consciousness/confusion EXAM: CT HEAD WITHOUT CONTRAST TECHNIQUE: Contiguous axial images were obtained from the base of the skull through the vertex without intravenous contrast. COMPARISON:  None. FINDINGS: Brain: Ventricles and sulci are within normal limits for age. There is no intracranial mass, hemorrhage, extra-axial fluid collection, or midline shift. There  is slight small vessel disease in the centra semiovale bilaterally. Elsewhere brain parenchyma appears unremarkable. No  acute infarct is evident. Vascular: There is no hyperdense vessel. There is no appreciable vascular calcification. Skull: The bony calvarium appears intact. Sinuses/Orbits: Visualized paranasal sinuses are clear. Visualized orbits appear symmetric bilaterally. Other: Mastoid air cells are clear. IMPRESSION: Slight periventricular small vessel disease. No demonstrable acute infarct. No mass or hemorrhage. Electronically Signed   By: Lowella Grip III M.D.   On: 01/28/2019 09:36   Mr Brain Wo Contrast  Result Date: 01/28/2019 CLINICAL DATA:  TIA, confusion EXAM: MRI HEAD WITHOUT CONTRAST TECHNIQUE: Multiplanar, multiecho pulse sequences of the brain and surrounding structures were obtained without intravenous contrast. COMPARISON:  CT head 01/28/2019 FINDINGS: Brain: Negative for acute infarct. Subtle hypodensity left anterior thalamus likely represents chronic ischemia. Mild chronic ischemic changes also present in the cerebral white matter bilaterally. Chronic microhemorrhage in the left parietal lobe. Negative for mass or edema. Ventricle size normal. Vascular: Normal arterial flow voids. Skull and upper cervical spine: Negative Sinuses/Orbits: Negative Other: None IMPRESSION: Negative for acute infarct Mild chronic ischemic changes left thalamus and cerebral white matter bilaterally. Electronically Signed   By: Franchot Gallo M.D.   On: 01/28/2019 13:14    EKG: Orders placed or performed during the hospital encounter of 01/28/19  . ED EKG  . ED EKG  . EKG 12-Lead  . EKG 12-Lead    IMPRESSION AND PLAN:  Pt is 68 y/o female presenting with speech trouble and memory  1. TIA obs Tele [facility] Echo, carotid dopplers Neuro eval Mri of brain  2. DM ssi  conitnue oral medication  3. HTN continue metoprolol and amlodopine  4. Neuropathy continue gabapentin  5. misc lovenox  6. nicotinue abuse smokinag cessation provided- 4 min spent, nicotine patch offred     All the records are  reviewed and case discussed with ED provider. Management plans discussed with the patient, family and they are in agreement.  CODE STATUS:    Code Status Orders  (From admission, onward)         Start     Ordered   01/28/19 1201  Full code  Continuous     01/28/19 1200        Code Status History    This patient has a current code status but no historical code status.   Advance Care Planning Activity       TOTAL TIME TAKING CARE OF THIS PATIENT: 55 minutes.    Dustin Flock M.D on 01/28/2019 at 3:35 PM  Between 7am to 6pm - Pager - [phone]  After 6pm go to [email or web address] - password [email or web address]  [facility] Office  [phone]  CC: Primary care physician; Kirk Ruths, MD

## 2019-01-28 NOTE — Progress Notes (Signed)
OT Cancellation Note  Patient Details Name: Alyssa Summers MRN: [id number] DOB: [birth year]   Cancelled Treatment:    Reason Eval/Treat Not Completed: Patient at procedure or test/ unavailable. Order received, chart reviewed. Pt out of room for testing. Will re-attempt OT evaluation at later date/time as pt is available and medically appropriate.  Jeni Salles, MPH, MS, OTR/L ascom [phone] 01/28/19, 4:08 PM

## 2019-01-28 NOTE — Procedures (Signed)
ELECTROENCEPHALOGRAM REPORT   Patient: Alyssa Summers       Room #: 131A-AA EEG No. ID: [id number] Age: 68 y.o.        Sex: female Referring Physician: Posey Pronto Report Date:  01/28/2019        Interpreting Physician: Alexis Goodell  History: [name] is an 68 y.o. female with altered mental status  Medications:  ASA, Neurontin, Insulin, Metformin, Toprol, Pravachol  Conditions of Recording:  This is a 21 channel routine scalp EEG performed with bipolar and monopolar montages arranged in accordance to the international 10/20 system of electrode placement. One channel was dedicated to EKG recording.  The patient is in the awake, drowsy and asleep states.  Description:  The waking background activity consists of a low voltage, symmetrical, fairly well organized, 9 Hz alpha activity, seen from the parieto-occipital and posterior temporal regions.  Low voltage fast activity, poorly organized, is seen anteriorly and is at times superimposed on more posterior regions.  A mixture of theta and alpha rhythms are seen from the central and temporal regions. The patient drowses with slowing to irregular, low voltage theta and beta activity.   The patient goes in to a light sleep with symmetrical sleep spindles, vertex central sharp transients and irregular slow activity.  No epileptiform activity is noted.   Hyperventilation was not performed.  Intermittent photic stimulation was performed but failed to illicit any change in the tracing.  IMPRESSION: Normal electroencephalogram, awake, asleep and with activation procedures. There are no focal lateralizing or epileptiform features.   Alexis Goodell, MD Neurology [phone] 01/28/2019, 6:26 PM

## 2019-01-28 NOTE — Progress Notes (Signed)
PT Cancellation Note  Patient Details Name: Thaina Licano MRN: [id number] DOB: Dec 30, 1950   Cancelled Treatment:    Reason Eval/Treat Not Completed: Patient at procedure or test/unavailable(Consult received and chart reviewed.  Patient currently off unit for diagnostic testing.  Will continue efforts next date as medically appropriate and available.)   Cyra Spader H. Owens Shark, PT, DPT, NCS 01/28/19, 3:50 PM [phone]

## 2019-01-28 NOTE — ED Notes (Signed)
Covid sent to lab

## 2019-01-28 NOTE — Progress Notes (Signed)
SLP Cancellation Note  Patient Details Name: Alyssa Summers MRN: [id number] DOB: 1950-10-21   Cancelled treatment:       Reason Eval/Treat Not Completed: Patient at procedure or test/unavailable(pt out of room. ST services will f/u tomorrow. NSG updated)     Orinda Kenner, [place], CCC-SLP Watson,Katherine 01/28/2019, 3:48 PM

## 2019-01-28 NOTE — Progress Notes (Signed)
SLP Cancellation Note  Patient Details Name: Shaida Kittelson MRN: [id number] DOB: 07-Jul-1950   Cancelled treatment:       Reason Eval/Treat Not Completed: Patient at procedure or test/unavailable(chart reviewed; consulted NSG). Pt just admitted to the ED this AM. Now returning to room w/ ongoing NSG assessment. Pt also eating her Lunch meal - a [employer] bag in bed beside her; drink (no overt deficits noted). Noted MRI results - Negative for new acute infarct; Chronic microhemorrhage in the left parietal lobe.  ST services will f/u tomorrow w/ Cognitive-linguistic assessment when pt has been assessed by Neurology and is more available. NSG agreed.       Orinda Kenner, MS, CCC-SLP Watson,Katherine 01/28/2019, 2:14 PM

## 2019-01-28 NOTE — Progress Notes (Signed)
OT Cancellation Note  Patient Details Name: Alyssa Summers MRN: [id number] DOB: 1950-06-11   Cancelled Treatment:    Reason Eval/Treat Not Completed: Patient at procedure or test/ unavailable. Thank you for the OT consult. Order received and chart reviewed. Pt noted to be off the floor for imaging. Will follow acutely and re-attempt at a later date/time as available and pt medically appropriate for therapy.   Shara Blazing, M.S., OTR/L Ascom: [phone] 01/28/19, 12:46 PM

## 2019-01-28 NOTE — ED Notes (Signed)
Transported to DG chest and CT

## 2019-01-28 NOTE — ED Provider Notes (Signed)
[facility] Emergency Department Provider Note  ____________________________________________   First MD Initiated Contact with Patient 01/28/19 [phone]     (approximate)  I have reviewed the triage vital signs and the nursing notes.   HISTORY  Chief Complaint Altered Mental Status    HPI Alyssa Summers is a 68 y.o. female  Here with episodes of confusion. Pt reports that intermittently, but increasingly frequent, she is having difficulty remembering things. She reports that she'll be mid conversation then suddenly forget what she was trying to say. She knows what she wants to say but then forgets it or is unablet o say it. This is all new ofr her x 1 week. No h/o CVA or TIA. When asking what medical problems she has, she had an episode with interviewer and just said "I can't remember. I can't say." No vision changes. No focal numbness or weakness. No recent illnesses. No recent med changes. Episodes occur randomly w/o specific triggers. No known alleviating or aggravating factors.        Past Medical History:  Diagnosis Date  . Diabetes mellitus without complication ([place])   . Hypertension   . Tachycardia     Patient Active Problem List   Diagnosis Date Noted  . TIA (transient ischemic attack) 01/28/2019    History reviewed. No pertinent surgical history.  Prior to Admission medications   Medication Sig Start Date End Date Taking? Authorizing Provider  amLODipine (NORVASC) 5 MG tablet Take 5 mg by mouth daily. 11/19/18  Yes [provider]  aspirin EC 81 MG tablet Take 81 mg by mouth daily. 09/23/18  Yes [provider]  gabapentin (NEURONTIN) 300 MG capsule Take 300-600 mg by mouth See admin instructions. Take 1 capsule (300mg ) by mouth every morning and daily at lunchtime then take 2 capsules (600mg ) by mouth every night at bedtime 12/01/18  Yes [provider]  JARDIANCE 25 MG TABS tablet Take 25 mg by mouth daily. 10/25/18  Yes  [provider]  lovastatin (MEVACOR) 40 MG tablet Take 40 mg by mouth daily after supper. 09/23/18  Yes [provider]  metFORMIN (GLUCOPHAGE-XR) 500 MG 24 hr tablet Take 1,000 mg by mouth 2 (two) times daily. 11/16/18  Yes [provider]  metoprolol succinate (TOPROL-XL) 50 MG 24 hr tablet Take 50 mg by mouth daily. 09/24/18  Yes [provider]  montelukast (SINGULAIR) 10 MG tablet Take 10 mg by mouth daily. 10/22/18  Yes [provider]  oxyCODONE (ROXICODONE) 5 MG immediate release tablet Take 1 tablet (5 mg total) by mouth every 6 (six) hours as needed. Patient not taking: Reported on 01/28/2019 11/25/18 11/25/19  Duanne Guess, PA-C    Allergies Glipizide  History reviewed. No pertinent family history.  Social History Social History   Tobacco Use  . Smoking status: Current Every Day Smoker    Packs/day: 0.50    Types: Cigarettes  . Smokeless tobacco: Never Used  Substance Use Topics  . Alcohol use: Yes  . Drug use: Never    Review of Systems  Review of Systems  Constitutional: Positive for fatigue. Negative for fever.  HENT: Negative for congestion and sore throat.   Eyes: Negative for visual disturbance.  Respiratory: Negative for cough and shortness of breath.   Cardiovascular: Negative for chest pain.  Gastrointestinal: Negative for abdominal pain and diarrhea.  Genitourinary: Negative for flank pain.  Musculoskeletal: Negative for back pain and neck pain.  Skin: Negative for rash and wound.  Neurological: Positive for speech difficulty and weakness.  Psychiatric/Behavioral: Positive for confusion.     ____________________________________________  PHYSICAL EXAM:      VITAL SIGNS: ED Triage Vitals  Enc Vitals Group     BP 01/28/19 0835 (!) 119/92     Pulse Rate 01/28/19 0835 90     Resp 01/28/19 0835 17     Temp 01/28/19 0835 98.2 F (36.8 C)     Temp Source 01/28/19 0835 Oral     SpO2 01/28/19 0827 97 %     Weight  01/28/19 0833 161 lb (73 kg)     Height 01/28/19 0833 5\' 8"  (1.727 m)     Head Circumference --      Peak Flow --      Pain Score 01/28/19 0832 0     Pain Loc --      Pain Edu? --      Excl. in [place]? --      Physical Exam Vitals signs and nursing note reviewed.  Constitutional:      General: She is not in acute distress.    Appearance: She is well-developed.  HENT:     Head: Normocephalic and atraumatic.  Eyes:     Conjunctiva/sclera: Conjunctivae normal.  Neck:     Musculoskeletal: Neck supple.  Cardiovascular:     Rate and Rhythm: Normal rate and regular rhythm.     Heart sounds: Normal heart sounds. No murmur. No friction rub.  Pulmonary:     Effort: Pulmonary effort is normal. No respiratory distress.     Breath sounds: Normal breath sounds. No wheezing or rales.  Abdominal:     General: There is no distension.     Palpations: Abdomen is soft.     Tenderness: There is no abdominal tenderness.  Skin:    General: Skin is warm.     Capillary Refill: Capillary refill takes less than 2 seconds.  Neurological:     Mental Status: She is alert and oriented to person, place, and time.     Motor: No abnormal muscle tone.      Neurological Exam:  Mental Status: Alert and oriented to person, place, and time. Attention and concentration normal. Speech clear. Recent memory is intact. Cranial Nerves: Visual fields grossly intact. EOMI and PERRLA. No nystagmus noted. Facial sensation intact at forehead, maxillary cheek, and chin/mandible bilaterally. No facial asymmetry or weakness. Hearing grossly normal. Uvula is midline, and palate elevates symmetrically. Normal SCM and trapezius strength. Tongue midline without fasciculations. Motor: Muscle strength 5/5 in proximal and distal UE and LE bilaterally. No pronator drift. Muscle tone normal. Reflexes: 2+ and symmetrical in all four extremities.  Sensation: Intact to light touch in upper and lower extremities distally bilaterally.   Gait: Normal without ataxia. Coordination: Normal FTN bilaterally.     ____________________________________________   LABS (all labs ordered are listed, but only abnormal results are displayed)  Labs Reviewed  CBC WITH DIFFERENTIAL/PLATELET - Abnormal; Notable for the following components:      Result Value   RBC 5.54 (*)    Hemoglobin 16.0 (*)    HCT 47.8 (*)    All other components within normal limits  COMPREHENSIVE METABOLIC PANEL - Abnormal; Notable for the following components:   Glucose, Bld 157 (*)    All other components within normal limits  URINALYSIS, COMPLETE (UACMP) WITH MICROSCOPIC - Abnormal; Notable for the following components:   Color, Urine YELLOW (*)    APPearance CLEAR (*)    Glucose,  UA >=500 (*)    Ketones, ur 20 (*)    Bacteria, UA RARE (*)    All other components within normal limits  SARS CORONAVIRUS 2 (TAT 6-24 HRS)  LIPASE, BLOOD  HIV ANTIBODY (ROUTINE TESTING W REFLEX)  CBC  CREATININE, SERUM    ____________________________________________  EKG: NSR, VR 87. RBBB, no acute ischemic changes. QRS 121, QTc 473.  ________________________________________  RADIOLOGY All imaging, including plain films, CT scans, and ultrasounds, independently reviewed by me, and interpretations confirmed via formal radiology reads.  ED MD interpretation:   CT Head: NAICA CXR: Negative  Official radiology report(s): Dg Chest 2 View  Result Date: 01/28/2019 CLINICAL DATA:  Altered mental status. EXAM: CHEST - 2 VIEW COMPARISON:  08/14/2018 FINDINGS: The heart size and mediastinal contours are within normal limits. Both lungs are clear. The visualized skeletal structures are unremarkable. IMPRESSION: No active cardiopulmonary disease. Electronically Signed   By: Marlaine Hind M.D.   On: 01/28/2019 09:34   Ct Head Wo Contrast  Result Date: 01/28/2019 CLINICAL DATA:  Altered level of consciousness/confusion EXAM: CT HEAD WITHOUT CONTRAST TECHNIQUE: Contiguous  axial images were obtained from the base of the skull through the vertex without intravenous contrast. COMPARISON:  None. FINDINGS: Brain: Ventricles and sulci are within normal limits for age. There is no intracranial mass, hemorrhage, extra-axial fluid collection, or midline shift. There is slight small vessel disease in the centra semiovale bilaterally. Elsewhere brain parenchyma appears unremarkable. No acute infarct is evident. Vascular: There is no hyperdense vessel. There is no appreciable vascular calcification. Skull: The bony calvarium appears intact. Sinuses/Orbits: Visualized paranasal sinuses are clear. Visualized orbits appear symmetric bilaterally. Other: Mastoid air cells are clear. IMPRESSION: Slight periventricular small vessel disease. No demonstrable acute infarct. No mass or hemorrhage. Electronically Signed   By: Lowella Grip III M.D.   On: 01/28/2019 09:36    ____________________________________________  PROCEDURES   Procedure(s) performed (including Critical Care):  Procedures  ____________________________________________  INITIAL IMPRESSION / MDM / [place] / ED COURSE  As part of my medical decision making, I reviewed the following data within the electronic MEDICAL RECORD NUMBER Notes from prior ED visits and [place] Controlled Substance Database      *Shenitha Degolyer was evaluated in Emergency Department on 01/28/2019 for the symptoms described in the history of present illness. She was evaluated in the context of the global COVID-19 pandemic, which necessitated consideration that the patient might be at risk for infection with the SARS-CoV-2 virus that causes COVID-19. Institutional protocols and algorithms that pertain to the evaluation of patients at risk for COVID-19 are in a state of rapid change based on information released by regulatory bodies including the CDC and federal and state organizations. These policies and algorithms were followed during the  patient's care in the ED.  Some ED evaluations and interventions may be delayed as a result of limited staffing during the pandemic.*   Clinical Course as of Jan 27 1241  Fri Jan 28, 2019  1043 68 yo F here with intermittent confusion, possible word finding difficulty. CT head shows small vessel disease. Labs reassuring. Unclear etiology but given new onset, risk factors, ongoing sx, will admit for MRI/TIA eval,possible EEG. Dr. Doy Mince to see.   [CI]    Clinical Course User Index [CI] Duffy Bruce, MD    Medical Decision Making: As above  ____________________________________________  FINAL CLINICAL IMPRESSION(S) / ED DIAGNOSES  Final diagnoses:  Transient alteration of awareness     MEDICATIONS GIVEN  DURING THIS VISIT:  Medications  aspirin EC tablet 81 mg (has no administration in time range)  amLODipine (NORVASC) tablet 5 mg (has no administration in time range)  metoprolol succinate (TOPROL-XL) 24 hr tablet 50 mg (has no administration in time range)  pravastatin (PRAVACHOL) tablet 40 mg (has no administration in time range)  empagliflozin (JARDIANCE) tablet 25 mg (has no administration in time range)  metFORMIN (GLUCOPHAGE-XR) 24 hr tablet 1,000 mg (has no administration in time range)  gabapentin (NEURONTIN) capsule 300-600 mg (has no administration in time range)  montelukast (SINGULAIR) tablet 10 mg (has no administration in time range)   stroke: mapping our early stages of recovery book (has no administration in time range)  acetaminophen (TYLENOL) tablet 650 mg (has no administration in time range)    Or  acetaminophen (TYLENOL) solution 650 mg (has no administration in time range)    Or  acetaminophen (TYLENOL) suppository 650 mg (has no administration in time range)  enoxaparin (LOVENOX) injection 40 mg (has no administration in time range)  aspirin chewable tablet 324 mg (324 mg Oral Given 01/28/19 1204)     ED Discharge Orders    None       Note:   This document was prepared using Dragon voice recognition software and may include unintentional dictation errors.   Duffy Bruce, MD 01/28/19 1242

## 2019-01-28 NOTE — ED Notes (Signed)
ED TO INPATIENT HANDOFF REPORT  ED Nurse Name and Phone #:  Mac [phone]  S Name/Age/Gender Alyssa Summers 68 y.o. female Room/Bed: ED24A/ED24A  Code Status   Code Status: Not on file  Home/SNF/Other Home Patient oriented to: self, place, time and situation Is this baseline? Yes   Triage Complete: Triage complete  Chief Complaint change in mental status  Triage Note Pt from home via EMS due to increased confusion since Monday that is altered from baseline. Sister suggested pt call due to sister's experience with previous stroke.    Allergies Allergies  Allergen Reactions  . Glipizide     Level of Care/Admitting Diagnosis ED Disposition    ED Disposition Condition [place] [facility]: [facility] [100120]  Level of Care: Med-Surg [16]  Covid Evaluation: Asymptomatic Screening Protocol (No Symptoms)  Diagnosis: TIA (transient ischemic attack) [536144]  Admitting Physician: Dustin Flock [[id number]]  Attending Physician: Dustin Flock [[id number]]  PT Class (Do Not Modify): Observation [104]  PT Acc Code (Do Not Modify): Observation [10022]       B Medical/Surgery History Past Medical History:  Diagnosis Date  . Diabetes mellitus without complication ([place])   . Hypertension   . Tachycardia    History reviewed. No pertinent surgical history.   A IV Location/Drains/Wounds Patient Lines/Drains/Airways Status   Active Line/Drains/Airways    Name:   Placement date:   Placement time:   Site:   Days:   Peripheral IV 01/28/19 Left Antecubital   01/28/19    0957    Antecubital   less than 1          Intake/Output Last 24 hours No intake or output data in the 24 hours ending 01/28/19 1200  Labs/Imaging Results for orders placed or performed during the hospital encounter of 01/28/19 (from the past 48 hour(s))  CBC with Differential     Status: Abnormal   Collection Time: 01/28/19  9:52 AM  Result Value Ref Range   WBC 7.9 4.0 - 10.5  K/uL   RBC 5.54 (H) 3.87 - 5.11 MIL/uL   Hemoglobin 16.0 (H) 12.0 - 15.0 g/dL   HCT 47.8 (H) 36.0 - 46.0 %   MCV 86.3 80.0 - 100.0 fL   MCH 28.9 26.0 - 34.0 pg   MCHC 33.5 30.0 - 36.0 g/dL   RDW 14.6 11.5 - 15.5 %   Platelets 256 150 - 400 K/uL   nRBC 0.0 0.0 - 0.2 %   Neutrophils Relative % 57 %   Neutro Abs 4.6 1.7 - 7.7 K/uL   Lymphocytes Relative 28 %   Lymphs Abs 2.2 0.7 - 4.0 K/uL   Monocytes Relative 11 %   Monocytes Absolute 0.8 0.1 - 1.0 K/uL   Eosinophils Relative 2 %   Eosinophils Absolute 0.2 0.0 - 0.5 K/uL   Basophils Relative 2 %   Basophils Absolute 0.1 0.0 - 0.1 K/uL   Immature Granulocytes 0 %   Abs Immature Granulocytes 0.03 0.00 - 0.07 K/uL    Comment: Performed at [facility], [address] [place], [place] [address]  Comprehensive metabolic panel     Status: Abnormal   Collection Time: 01/28/19  9:52 AM  Result Value Ref Range   Sodium 137 135 - 145 mmol/L   Potassium 4.0 3.5 - 5.1 mmol/L   Chloride 102 98 - 111 mmol/L   CO2 23 22 - 32 mmol/L   Glucose, Bld 157 (H) 70 - 99 mg/dL  BUN 9 8 - 23 mg/dL   Creatinine, Ser 0.69 0.44 - 1.00 mg/dL   Calcium 9.7 8.9 - 10.3 mg/dL   Total Protein 7.6 6.5 - 8.1 g/dL   Albumin 4.5 3.5 - 5.0 g/dL   AST 21 15 - 41 U/L   ALT 26 0 - 44 U/L   Alkaline Phosphatase 58 38 - 126 U/L   Total Bilirubin 1.1 0.3 - 1.2 mg/dL   GFR calc non Af Amer >60 >60 mL/min   GFR calc Af Amer >60 >60 mL/min   Anion gap 12 5 - 15    Comment: Performed at [facility], [address] [place], [place] [address]  Urinalysis, Complete w Microscopic     Status: Abnormal   Collection Time: 01/28/19  9:52 AM  Result Value Ref Range   Color, Urine YELLOW (A) YELLOW   APPearance CLEAR (A) CLEAR   Specific Gravity, Urine 1.028 1.005 - 1.030   pH 5.0 5.0 - 8.0   Glucose, UA >=500 (A) NEGATIVE mg/dL   Hgb urine dipstick NEGATIVE NEGATIVE   Bilirubin Urine NEGATIVE NEGATIVE   Ketones, ur 20 (A) NEGATIVE mg/dL   Protein,  ur NEGATIVE NEGATIVE mg/dL   Nitrite NEGATIVE NEGATIVE   Leukocytes,Ua NEGATIVE NEGATIVE   WBC, UA 0-5 0 - 5 WBC/hpf   Bacteria, UA RARE (A) NONE SEEN   Squamous Epithelial / LPF 0-5 0 - 5   Mucus PRESENT     Comment: Performed at [facility], [address] [place], [place] [address]  Lipase, blood     Status: None   Collection Time: 01/28/19  9:52 AM  Result Value Ref Range   Lipase 29 11 - 51 U/L    Comment: Performed at [facility], [address], [place], [place] [address]   Dg Chest 2 View  Result Date: 01/28/2019 CLINICAL DATA:  Altered mental status. EXAM: CHEST - 2 VIEW COMPARISON:  08/14/2018 FINDINGS: The heart size and mediastinal contours are within normal limits. Both lungs are clear. The visualized skeletal structures are unremarkable. IMPRESSION: No active cardiopulmonary disease. Electronically Signed   By: Marlaine Hind M.D.   On: 01/28/2019 09:34   Ct Head Wo Contrast  Result Date: 01/28/2019 CLINICAL DATA:  Altered level of consciousness/confusion EXAM: CT HEAD WITHOUT CONTRAST TECHNIQUE: Contiguous axial images were obtained from the base of the skull through the vertex without intravenous contrast. COMPARISON:  None. FINDINGS: Brain: Ventricles and sulci are within normal limits for age. There is no intracranial mass, hemorrhage, extra-axial fluid collection, or midline shift. There is slight small vessel disease in the centra semiovale bilaterally. Elsewhere brain parenchyma appears unremarkable. No acute infarct is evident. Vascular: There is no hyperdense vessel. There is no appreciable vascular calcification. Skull: The bony calvarium appears intact. Sinuses/Orbits: Visualized paranasal sinuses are clear. Visualized orbits appear symmetric bilaterally. Other: Mastoid air cells are clear. IMPRESSION: Slight periventricular small vessel disease. No demonstrable acute infarct. No mass or hemorrhage. Electronically Signed   By: Lowella Grip III  M.D.   On: 01/28/2019 09:36    Pending Labs Unresulted Labs (From admission, onward)    Start     Ordered   01/28/19 1137  SARS CORONAVIRUS 2 (TAT 6-24 HRS) Nasopharyngeal Nasopharyngeal Swab  (Asymptomatic/Tier 2 Patients Labs)  Once,   STAT    Question Answer Comment  Is this test for diagnosis or screening Screening   Symptomatic for COVID-19 as defined by CDC No   Hospitalized for COVID-19  No   Admitted to ICU for COVID-19 No   Previously tested for COVID-19 No   Resident in a congregate (group) care setting No   Employed in healthcare setting No   Pregnant No      01/28/19 1136   Signed and Held  HIV antibody (Routine Testing)  Once,   R     Signed and Held   Signed and Held  Hemoglobin A1c  Tomorrow morning,   R     Signed and Held   Signed and Held  Lipid panel  Tomorrow morning,   R    Comments: Fasting    Signed and Held   Signed and Held  CBC  (enoxaparin (LOVENOX)    CrCl >/= 30 ml/min)  Once,   R    Comments: Baseline for enoxaparin therapy IF NOT ALREADY DRAWN.  Notify MD if PLT < 100 K.    Signed and Held   Signed and Held  Creatinine, serum  (enoxaparin (LOVENOX)    CrCl >/= 30 ml/min)  Once,   R    Comments: Baseline for enoxaparin therapy IF NOT ALREADY DRAWN.    Signed and Held   Signed and Held  Creatinine, serum  (enoxaparin (LOVENOX)    CrCl >/= 30 ml/min)  Weekly,   R    Comments: while on enoxaparin therapy    Signed and Held          Vitals/Pain Today's Vitals   01/28/19 1030 01/28/19 1127 01/28/19 1130 01/28/19 1145  BP: 117/73  (!) 120/96   Pulse: 86  84 84  Resp:      Temp:      TempSrc:      SpO2: 96%  98% 98%  Weight:      Height:      PainSc:  0-No pain      Isolation Precautions No active isolations  Medications Medications  aspirin chewable tablet 324 mg (has no administration in time range)    Mobility walks Low fall risk   Focused Assessments Neuro Assessment Handoff:  Swallow screen pass? Yes    NIH Stroke  Scale ( + Modified Stroke Scale Criteria)  LOC Questions (1b. )   +: Answers both questions correctly LOC Commands (1c. )   + : Performs both tasks correctly Best Gaze (2. )  +: Normal Visual (3. )  +: No visual loss Motor Arm, Left (5a. )   +: No drift Motor Arm, Right (5b. )   +: No drift Motor Leg, Left (6a. )   +: No drift Motor Leg, Right (6b. )   +: No drift Sensory (8. )   +: Normal, no sensory loss Best Language (9. )   +: Mild-to-moderate aphasia Extinction/Inattention (11.)   +: No Abnormality Modified SS Total  +: 1     Neuro Assessment: Within Defined Limits Neuro Checks:      Last Documented NIHSS Modified Score: 1 (01/28/19 1001) Has TPA been given? No If patient is a Neuro Trauma and patient is going to OR before floor call report to [place] nurse: [phone] or [phone]     R Recommendations: See Admitting Provider Note  Report given to:   Additional Notes:

## 2019-01-28 NOTE — Progress Notes (Signed)
eeg completed ° °

## 2019-01-28 NOTE — Progress Notes (Signed)
*  PRELIMINARY RESULTS* Echocardiogram 2D Echocardiogram has been performed.  Alyssa Summers 01/28/2019, 2:47 PM

## 2019-01-28 NOTE — Consult Note (Signed)
Reason for Consult:Memory lapses Referring Physician: Posey Pronto  CC: Memory Lapses  HPI: Alyssa Summers is an 68 y.o. female with a history of DM and HTN who reports that she was in her usual state of health until awakening on 8/31.  Reports that since that time she has felt as if she was in a fog.  Will have multiple times a day when she will forget what she wants to say and is unable to express herself.  Later on will remember what she wanted to say.  Has has no focal complaints.  Has had no change in medications or activities.    Past Medical History:  Diagnosis Date  . Diabetes mellitus without complication ([place])   . Hypertension   . Tachycardia     History reviewed. No pertinent surgical history.  History reviewed. No pertinent family history.  Social History:  reports that she has been smoking cigarettes. She has been smoking about 0.50 packs per day. She has never used smokeless tobacco. She reports current alcohol use. She reports that she does not use drugs.  Allergies  Allergen Reactions  . Glipizide     Medications:  I have reviewed the patient's current medications. Prior to Admission:  Medications Prior to Admission  Medication Sig Dispense Refill Last Dose  . amLODipine (NORVASC) 5 MG tablet Take 5 mg by mouth daily.   Past Week at Unknown time  . aspirin EC 81 MG tablet Take 81 mg by mouth daily.   Past Week at Unknown time  . gabapentin (NEURONTIN) 300 MG capsule Take 300-600 mg by mouth See admin instructions. Take 1 capsule (376m) by mouth every morning and daily at lunchtime then take 2 capsules (6036m by mouth every night at bedtime   Past Week at Unknown time  . JARDIANCE 25 MG TABS tablet Take 25 mg by mouth daily.   Past Week at Unknown time  . lovastatin (MEVACOR) 40 MG tablet Take 40 mg by mouth daily after supper.   Past Week at Unknown time  . metFORMIN (GLUCOPHAGE-XR) 500 MG 24 hr tablet Take 1,000 mg by mouth 2 (two) times daily.   Past Week at Unknown  time  . metoprolol succinate (TOPROL-XL) 50 MG 24 hr tablet Take 50 mg by mouth daily.   Past Week at Unknown time  . montelukast (SINGULAIR) 10 MG tablet Take 10 mg by mouth daily.   Past Week at Unknown time  . oxyCODONE (ROXICODONE) 5 MG immediate release tablet Take 1 tablet (5 mg total) by mouth every 6 (six) hours as needed. (Patient not taking: Reported on 01/28/2019) 20 tablet 0 Not Taking at Unknown time   Scheduled: . amLODipine  5 mg Oral Daily  . [START ON 01/29/2019] aspirin EC  81 mg Oral Daily  . enoxaparin (LOVENOX) injection  40 mg Subcutaneous Q24H  . gabapentin  300 mg Oral BID WC  . gabapentin  600 mg Oral QHS  . insulin aspart  0-5 Units Subcutaneous QHS  . insulin aspart  0-9 Units Subcutaneous TID WC  . metFORMIN  1,000 mg Oral BID WC  . metoprolol succinate  50 mg Oral Daily  . montelukast  10 mg Oral Daily  . pravastatin  40 mg Oral q1800    ROS: History obtained from the patient  General ROS: negative for - chills, fatigue, fever, night sweats, weight gain or weight loss Psychological ROS: negative for - behavioral disorder, hallucinations, memory difficulties, mood swings or suicidal ideation Ophthalmic ROS: negative for -  blurry vision, double vision, eye pain or loss of vision ENT ROS: negative for - epistaxis, nasal discharge, oral lesions, sore throat, tinnitus or vertigo Allergy and Immunology ROS: negative for - hives or itchy/watery eyes Hematological and Lymphatic ROS: negative for - bleeding problems, bruising or swollen lymph nodes Endocrine ROS: negative for - galactorrhea, hair pattern changes, polydipsia/polyuria or temperature intolerance Respiratory ROS: negative for - cough, hemoptysis, shortness of breath or wheezing Cardiovascular ROS: negative for - chest pain, dyspnea on exertion, edema or irregular heartbeat Gastrointestinal ROS: negative for - abdominal pain, diarrhea, hematemesis, nausea/vomiting or stool incontinence Genito-Urinary ROS:  negative for - dysuria, hematuria, incontinence or urinary frequency/urgency Musculoskeletal ROS: negative for - joint swelling or muscular weakness Neurological ROS: as noted in HPI Dermatological ROS: negative for rash and skin lesion changes  Physical Examination: Blood pressure 129/77, pulse 73, temperature 97.7 F (36.5 C), temperature source Oral, resp. rate 18, height _0  (1.727 m), weight 73 kg, SpO2 100 %.  HEENT-  Normocephalic, no lesions, without obvious abnormality.  Normal external eye and conjunctiva.  Normal TM's bilaterally.  Normal auditory canals and external ears. Normal external nose, mucus membranes and septum.  Normal pharynx. Cardiovascular- S1, S2 normal, pulses palpable throughout   Lungs- chest clear, no wheezing, rales, normal symmetric air entry Abdomen- soft, non-tender; bowel sounds normal; no masses,  no organomegaly Extremities- no edema Lymph-no adenopathy palpable Musculoskeletal-no joint tenderness, deformity or swelling Skin-warm and dry, no hyperpigmentation, vitiligo, or suspicious lesions  Neurological Examination   Mental Status: Alert, oriented.  Multiple times during the evaluation the patient stopped talking because she could not remember simple things like what she was taking her medications for or what occupation she retired from.  Speech fluent without evidence of aphasia.  Able to follow 3 step commands without difficulty. Cranial Nerves: II: Discs flat bilaterally; Visual fields grossly normal, pupils equal, round, reactive to light and accommodation III,IV, VI: ptosis not present, extra-ocular motions intact bilaterally V,VII: smile symmetric, facial light touch sensation normal bilaterally VIII: hearing normal bilaterally IX,X: gag reflex present XI: bilateral shoulder shrug XII: midline tongue extension Motor: Right : Upper extremity   5/5    Left:     Upper extremity   5/5  Lower extremity   5/5     Lower extremity   5/5 Tone and  bulk:normal tone throughout; no atrophy noted Sensory: Pinprick and light touch intact throughout, bilaterally Deep Tendon Reflexes: Symmetric throughout Plantars: Right: downgoing   Left: downgoing Cerebellar: normal finger-to-nose, normal rapid alternating movements and normal heel-to-shin test Gait: normal gait and station   Laboratory Studies:   Basic Metabolic Panel: Recent Labs  Lab 01/28/19 0952  NA 137  K 4.0  CL 102  CO2 23  GLUCOSE 157*  BUN 9  CREATININE 0.69  CALCIUM 9.7    Liver Function Tests: Recent Labs  Lab 01/28/19 0952  AST 21  ALT 26  ALKPHOS 58  BILITOT 1.1  PROT 7.6  ALBUMIN 4.5   Recent Labs  Lab 01/28/19 0952  LIPASE 29   No results for input(s): AMMONIA in the last 168 hours.  CBC: Recent Labs  Lab 01/28/19 0952  WBC 7.9  NEUTROABS 4.6  HGB 16.0*  HCT 47.8*  MCV 86.3  PLT 256    Cardiac Enzymes: No results for input(s): CKTOTAL, CKMB, CKMBINDEX, TROPONINI in the last 168 hours.  BNP: Invalid input(s): POCBNP  CBG: No results for input(s): GLUCAP in the last 168 hours.  Microbiology:  No results found for this or any previous visit.  Coagulation Studies: No results for input(s): LABPROT, INR in the last 72 hours.  Urinalysis:  Recent Labs  Lab 01/28/19 0952  COLORURINE YELLOW*  LABSPEC 1.028  PHURINE 5.0  GLUCOSEU >=500*  HGBUR NEGATIVE  BILIRUBINUR NEGATIVE  KETONESUR 20*  PROTEINUR NEGATIVE  NITRITE NEGATIVE  LEUKOCYTESUR NEGATIVE    Lipid Panel:  No results found for: CHOL, TRIG, HDL, CHOLHDL, VLDL, LDLCALC  HgbA1C: No results found for: HGBA1C  Urine Drug Screen:  No results found for: LABOPIA, COCAINSCRNUR, LABBENZ, AMPHETMU, THCU, LABBARB  Alcohol Level: No results for input(s): ETH in the last 168 hours.  Other results: EKG: sinus rhythm at 87 bpm, RBBB.  Imaging: Dg Chest 2 View  Result Date: 01/28/2019 CLINICAL DATA:  Altered mental status. EXAM: CHEST - 2 VIEW COMPARISON:  08/14/2018  FINDINGS: The heart size and mediastinal contours are within normal limits. Both lungs are clear. The visualized skeletal structures are unremarkable. IMPRESSION: No active cardiopulmonary disease. Electronically Signed   By: Marlaine Hind M.D.   On: 01/28/2019 09:34   Ct Head Wo Contrast  Result Date: 01/28/2019 CLINICAL DATA:  Altered level of consciousness/confusion EXAM: CT HEAD WITHOUT CONTRAST TECHNIQUE: Contiguous axial images were obtained from the base of the skull through the vertex without intravenous contrast. COMPARISON:  None. FINDINGS: Brain: Ventricles and sulci are within normal limits for age. There is no intracranial mass, hemorrhage, extra-axial fluid collection, or midline shift. There is slight small vessel disease in the centra semiovale bilaterally. Elsewhere brain parenchyma appears unremarkable. No acute infarct is evident. Vascular: There is no hyperdense vessel. There is no appreciable vascular calcification. Skull: The bony calvarium appears intact. Sinuses/Orbits: Visualized paranasal sinuses are clear. Visualized orbits appear symmetric bilaterally. Other: Mastoid air cells are clear. IMPRESSION: Slight periventricular small vessel disease. No demonstrable acute infarct. No mass or hemorrhage. Electronically Signed   By: Lowella Grip III M.D.   On: 01/28/2019 09:36   Mr Brain Wo Contrast  Result Date: 01/28/2019 CLINICAL DATA:  TIA, confusion EXAM: MRI HEAD WITHOUT CONTRAST TECHNIQUE: Multiplanar, multiecho pulse sequences of the brain and surrounding structures were obtained without intravenous contrast. COMPARISON:  CT head 01/28/2019 FINDINGS: Brain: Negative for acute infarct. Subtle hypodensity left anterior thalamus likely represents chronic ischemia. Mild chronic ischemic changes also present in the cerebral white matter bilaterally. Chronic microhemorrhage in the left parietal lobe. Negative for mass or edema. Ventricle size normal. Vascular: Normal arterial flow  voids. Skull and upper cervical spine: Negative Sinuses/Orbits: Negative Other: None IMPRESSION: Negative for acute infarct Mild chronic ischemic changes left thalamus and cerebral white matter bilaterally. Electronically Signed   By: Franchot Gallo M.D.   On: 01/28/2019 13:14     Assessment/Plan: 68 year old female presenting with episodes of lapses of memory.  No focal neurological complaints.  Symptoms started acutely about 11 days ago.  Occur multiple times a day.  Etiology unclear. MRI of the brain reviewed and shows no acute changes.  Recommendations: 1. EEG 2. B12, TSH, ESR, B1, Mg, Phos, Prolactin,  3. CTA of head and neck 4. Telemetry 5. Frequent neuro checks 6. If above work up unremarkable patient to follow up on an outpatient basis for further testing.    Alexis Goodell, MD Neurology [phone] 01/28/2019, 3:15 PM

## 2019-01-28 NOTE — ED Notes (Signed)
Attempted to call report. Placed on hold. Will call back.

## 2019-01-29 DIAGNOSIS — R404 Transient alteration of awareness: Secondary | ICD-10-CM | POA: Diagnosis not present

## 2019-01-29 DIAGNOSIS — G459 Transient cerebral ischemic attack, unspecified: Secondary | ICD-10-CM | POA: Diagnosis not present

## 2019-01-29 LAB — GLUCOSE, CAPILLARY
Glucose-Capillary: 162 mg/dL — ABNORMAL HIGH (ref 70–99)
Glucose-Capillary: 171 mg/dL — ABNORMAL HIGH (ref 70–99)

## 2019-01-29 LAB — LIPID PANEL
Cholesterol: 135 mg/dL (ref 0–200)
HDL: 53 mg/dL (ref >40)
LDL Cholesterol: 48 mg/dL (ref 0–99)
Total CHOL/HDL Ratio: 2.5 RATIO
Triglycerides: 169 mg/dL — ABNORMAL HIGH (ref <150)
VLDL: 34 mg/dL (ref 0–40)

## 2019-01-29 LAB — ECHOCARDIOGRAM COMPLETE
Height: 68 in
Weight: 2576 oz

## 2019-01-29 LAB — SARS CORONAVIRUS 2 (TAT 6-24 HRS): SARS Coronavirus 2: NEGATIVE

## 2019-01-29 LAB — TSH: TSH: 2.01 u[IU]/mL (ref 0.350–4.500)

## 2019-01-29 LAB — HEMOGLOBIN A1C
Hgb A1c MFr Bld: 6.2 % — ABNORMAL HIGH (ref 4.8–5.6)
Mean Plasma Glucose: 131 mg/dL

## 2019-01-29 LAB — SEDIMENTATION RATE: Sed Rate: 7 mm/hr (ref 0–30)

## 2019-01-29 LAB — VITAMIN B12: Vitamin B-12: 158 pg/mL — ABNORMAL LOW (ref 180–914)

## 2019-01-29 MED ORDER — CYANOCOBALAMIN 1000 MCG/ML IJ SOLN
1000.0000 ug | Freq: Once | INTRAMUSCULAR | Status: AC
  Administered 2019-01-29: 12:00:00 1000 ug via INTRAMUSCULAR
  Filled 2019-01-29: qty 1

## 2019-01-29 MED ORDER — SODIUM CHLORIDE 0.9% FLUSH
3.0000 mL | Freq: Two times a day (BID) | INTRAVENOUS | Status: DC
  Administered 2019-01-29: 3 mL via INTRAVENOUS

## 2019-01-29 MED ORDER — SODIUM CHLORIDE 0.9% FLUSH
3.0000 mL | INTRAVENOUS | Status: DC | PRN
  Administered 2019-01-29: 3 mL via INTRAVENOUS

## 2019-01-29 MED ORDER — VITAMIN B-12 1000 MCG PO TABS: 1000.0000 ug | ORAL_TABLET | Freq: Every day | ORAL | 0 refills | Status: AC

## 2019-01-29 NOTE — Progress Notes (Signed)
OT Cancellation Note  Patient Details Name: Alyssa Summers MRN: [id number] DOB: 10-28-1950   Cancelled Treatment:     Order received for OT evaluation, patient screened this am and has been up and able to perform her basic self care, independent with feeding and does not appear to have any deficits of her UE.  She reports she feels back to baseline physically but reports some word finding problems and speech at times.  No OT needs at this time, please reconsult if needs arise. Adler Chartrand T Karey Suthers, OTR/L, CLT   Yehya Brendle 01/29/2019, 10:08 AM

## 2019-01-29 NOTE — Progress Notes (Signed)
Subjective: No new neurological complaints.    Objective: Current vital signs: BP 107/80 (BP Location: Right Arm)   Pulse 85   Temp 98.1 F (36.7 C) (Oral)   Resp 20   Ht 5' 8"  (1.727 m)   Wt 73 kg   SpO2 96%   BMI 24.48 kg/m  Vital signs in last 24 hours: Temp:  [97.7 F (36.5 C)-98.2 F (36.8 C)] 98.1 F (36.7 C) (09/12 0841) Pulse Rate:  [73-90] 85 (09/12 0841) Resp:  [16-20] 20 (09/12 0841) BP: (93-129)/(73-96) 107/80 (09/12 0841) SpO2:  [96 %-100 %] 96 % (09/12 0841)  Intake/Output from previous day: 09/11 0701 - 09/12 0700 In: 300 [P.O.:300] Out: -  Intake/Output this shift: No intake/output data recorded. Nutritional status:  Diet Order            Diet heart healthy/carb modified Room service appropriate? Yes; Fluid consistency: Thin  Diet effective now              Neurologic Exam: Mental Status: Alert, oriented.  Speech fluent.  Able to follow 3 step commands without difficulty. Cranial Nerves: II: Discs flat bilaterally; Visual fields grossly normal, pupils equal, round, reactive to light and accommodation III,IV, VI: ptosis not present, extra-ocular motions intact bilaterally V,VII: smile symmetric, facial light touch sensation normal bilaterally VIII: hearing normal bilaterally IX,X: gag reflex present XI: bilateral shoulder shrug XII: midline tongue extension Motor: 5/5 throughout Sensory: Pinprick and light touch intact throughout, bilaterally   Lab Results: Basic Metabolic Panel: Recent Labs  Lab 01/28/19 0952  NA 137  K 4.0  CL 102  CO2 23  GLUCOSE 157*  BUN 9  CREATININE 0.69  CALCIUM 9.7    Liver Function Tests: Recent Labs  Lab 01/28/19 0952  AST 21  ALT 26  ALKPHOS 58  BILITOT 1.1  PROT 7.6  ALBUMIN 4.5   Recent Labs  Lab 01/28/19 0952  LIPASE 29   No results for input(s): AMMONIA in the last 168 hours.  CBC: Recent Labs  Lab 01/28/19 0952  WBC 7.9  NEUTROABS 4.6  HGB 16.0*  HCT 47.8*  MCV 86.3  PLT  256    Cardiac Enzymes: No results for input(s): CKTOTAL, CKMB, CKMBINDEX, TROPONINI in the last 168 hours.  Lipid Panel: Recent Labs  Lab 01/29/19 0440  CHOL 135  TRIG 169*  HDL 53  CHOLHDL 2.5  VLDL 34  LDLCALC 48    CBG: Recent Labs  Lab 01/28/19 1709 01/28/19 2117 01/29/19 0756  GLUCAP 182* 219* 162*    Microbiology: Results for orders placed or performed during the hospital encounter of 01/28/19  SARS CORONAVIRUS 2 (TAT 6-24 HRS) Nasopharyngeal Nasopharyngeal Swab     Status: None   Collection Time: 01/28/19 11:26 AM   Specimen: Nasopharyngeal Swab  Result Value Ref Range Status   SARS Coronavirus 2 NEGATIVE NEGATIVE Final    Comment: (NOTE) SARS-CoV-2 target nucleic acids are NOT DETECTED. The SARS-CoV-2 RNA is generally detectable in upper and lower respiratory specimens during the acute phase of infection. Negative results do not preclude SARS-CoV-2 infection, do not rule out co-infections with other pathogens, and should not be used as the sole basis for treatment or other patient management decisions. Negative results must be combined with clinical observations, patient history, and epidemiological information. The expected result is Negative. Fact Sheet for Patients: SugarRoll.be Fact Sheet for Healthcare Providers: https://www.woods-mathews.com/ This test is not yet approved or cleared by the [place] FDA and  has been authorized for  detection and/or diagnosis of SARS-CoV-2 by FDA under an Emergency Use Authorization (EUA). This EUA will remain  in effect (meaning this test can be used) for the duration of the COVID-19 declaration under Section 56 4(b)(1) of the Act, 21 U.S.C. section 360bbb-3(b)(1), unless the authorization is terminated or revoked sooner. Performed at [facility], [place] [address] [place], [place] [address]     Coagulation Studies: No results for input(s): LABPROT, INR in  the last 72 hours.  Imaging: Ct Angio Head W Or Wo Contrast  Result Date: 01/28/2019 CLINICAL DATA:  68 year old female with confusion. No acute findings on brain MRI earlier today. EXAM: CT ANGIOGRAPHY HEAD AND NECK TECHNIQUE: Multidetector CT imaging of the head and neck was performed using the standard protocol during bolus administration of intravenous contrast. Multiplanar CT image reconstructions and MIPs were obtained to evaluate the vascular anatomy. Carotid stenosis measurements (when applicable) are obtained utilizing NASCET criteria, using the distal internal carotid diameter as the denominator. CONTRAST:  22m OMNIPAQUE IOHEXOL 350 MG/ML SOLN COMPARISON:  Brain MRI and plain head CT earlier today. FINDINGS: CTA NECK Skeleton: Absent dentition. Cervical spine degeneration with vacuum disc and reversal of lordosis. No acute osseous abnormality identified. Upper chest: Negative. Other neck: No acute findings in the neck. Aortic arch: No arch atherosclerosis. Three vessel arch configuration. Right carotid system: Mildly tortuous brachiocephalic artery. Tortuous proximal right CCA. Negative right carotid bifurcation. Highly tortuous cervical right ICA at the C1 level without stenosis. Left carotid system: Mild plaque at the left CCA origin without stenosis. Tortuous mid left CCA. Negative left carotid bifurcation. Highly tortuous cervical left ICA similar to the right side without stenosis. Vertebral arteries: Tortuous proximal right subclavian artery without plaque or stenosis. Normal right vertebral artery origin. Mildly tortuous right vertebral to the skull base without plaque or stenosis. Mild plaque at the left subclavian artery origin without stenosis. Mildly tortuous proximal left subclavian. Normal left vertebral artery origin. Tortuous left vertebral artery to the skull base without stenosis. CTA HEAD Posterior circulation: Codominant distal vertebral arteries without stenosis. Tortuous but  otherwise negative vertebrobasilar junction. Normal left PICA origin. Dominant appearing right AICA origin. Tortuous basilar artery is patent to the tip without stenosis. Normal SCA origins. Fetal type bilateral PCA origins. Bilateral PCA branches are within normal limits. Anterior circulation: Both ICA siphons are patent with minimal calcified plaque, tortuosity, but no stenosis. Normal left ophthalmic and posterior communicating artery origins. Normal right ophthalmic and posterior communicating artery origins. Patent carotid termini. Normal MCA and ACA origins. Tortuous A1 segments. Diminutive or absent anterior communicating artery. Bilateral ACA branches are within normal limits. Left MCA M1 and bifurcation are patent with tortuosity but without stenosis. Left MCA branches are within normal limits. Right MCA M1 segment is tortuous. Right MCA bifurcation is patent without stenosis. Right MCA branches are within normal limits. Venous sinuses: Early contrast timing but grossly patent. Anatomic variants: Fetal type PCA origins. Review of the MIP images confirms the above findings IMPRESSION: 1. Negative for large vessel occlusion. 2. Minimal atherosclerosis and no arterial stenosis in the head or neck. But generalized arterial tortuosity in the head and neck with no atherosclerosis or stenosis. 3. Cervical spine degeneration. Electronically Signed   By: HGenevie AnnM.D.   On: 01/28/2019 20:59   Dg Chest 2 View  Result Date: 01/28/2019 CLINICAL DATA:  Altered mental status. EXAM: CHEST - 2 VIEW COMPARISON:  08/14/2018 FINDINGS: The heart size and mediastinal contours are within normal limits. Both  lungs are clear. The visualized skeletal structures are unremarkable. IMPRESSION: No active cardiopulmonary disease. Electronically Signed   By: Marlaine Hind M.D.   On: 01/28/2019 09:34   Ct Head Wo Contrast  Result Date: 01/28/2019 CLINICAL DATA:  Altered level of consciousness/confusion EXAM: CT HEAD WITHOUT CONTRAST  TECHNIQUE: Contiguous axial images were obtained from the base of the skull through the vertex without intravenous contrast. COMPARISON:  None. FINDINGS: Brain: Ventricles and sulci are within normal limits for age. There is no intracranial mass, hemorrhage, extra-axial fluid collection, or midline shift. There is slight small vessel disease in the centra semiovale bilaterally. Elsewhere brain parenchyma appears unremarkable. No acute infarct is evident. Vascular: There is no hyperdense vessel. There is no appreciable vascular calcification. Skull: The bony calvarium appears intact. Sinuses/Orbits: Visualized paranasal sinuses are clear. Visualized orbits appear symmetric bilaterally. Other: Mastoid air cells are clear. IMPRESSION: Slight periventricular small vessel disease. No demonstrable acute infarct. No mass or hemorrhage. Electronically Signed   By: Lowella Grip III M.D.   On: 01/28/2019 09:36   Ct Angio Neck W Or Wo Contrast  Result Date: 01/28/2019 CLINICAL DATA:  68 year old female with confusion. No acute findings on brain MRI earlier today. EXAM: CT ANGIOGRAPHY HEAD AND NECK TECHNIQUE: Multidetector CT imaging of the head and neck was performed using the standard protocol during bolus administration of intravenous contrast. Multiplanar CT image reconstructions and MIPs were obtained to evaluate the vascular anatomy. Carotid stenosis measurements (when applicable) are obtained utilizing NASCET criteria, using the distal internal carotid diameter as the denominator. CONTRAST:  93m OMNIPAQUE IOHEXOL 350 MG/ML SOLN COMPARISON:  Brain MRI and plain head CT earlier today. FINDINGS: CTA NECK Skeleton: Absent dentition. Cervical spine degeneration with vacuum disc and reversal of lordosis. No acute osseous abnormality identified. Upper chest: Negative. Other neck: No acute findings in the neck. Aortic arch: No arch atherosclerosis. Three vessel arch configuration. Right carotid system: Mildly tortuous  brachiocephalic artery. Tortuous proximal right CCA. Negative right carotid bifurcation. Highly tortuous cervical right ICA at the C1 level without stenosis. Left carotid system: Mild plaque at the left CCA origin without stenosis. Tortuous mid left CCA. Negative left carotid bifurcation. Highly tortuous cervical left ICA similar to the right side without stenosis. Vertebral arteries: Tortuous proximal right subclavian artery without plaque or stenosis. Normal right vertebral artery origin. Mildly tortuous right vertebral to the skull base without plaque or stenosis. Mild plaque at the left subclavian artery origin without stenosis. Mildly tortuous proximal left subclavian. Normal left vertebral artery origin. Tortuous left vertebral artery to the skull base without stenosis. CTA HEAD Posterior circulation: Codominant distal vertebral arteries without stenosis. Tortuous but otherwise negative vertebrobasilar junction. Normal left PICA origin. Dominant appearing right AICA origin. Tortuous basilar artery is patent to the tip without stenosis. Normal SCA origins. Fetal type bilateral PCA origins. Bilateral PCA branches are within normal limits. Anterior circulation: Both ICA siphons are patent with minimal calcified plaque, tortuosity, but no stenosis. Normal left ophthalmic and posterior communicating artery origins. Normal right ophthalmic and posterior communicating artery origins. Patent carotid termini. Normal MCA and ACA origins. Tortuous A1 segments. Diminutive or absent anterior communicating artery. Bilateral ACA branches are within normal limits. Left MCA M1 and bifurcation are patent with tortuosity but without stenosis. Left MCA branches are within normal limits. Right MCA M1 segment is tortuous. Right MCA bifurcation is patent without stenosis. Right MCA branches are within normal limits. Venous sinuses: Early contrast timing but grossly patent. Anatomic variants: Fetal type PCA  origins. Review of the MIP  images confirms the above findings IMPRESSION: 1. Negative for large vessel occlusion. 2. Minimal atherosclerosis and no arterial stenosis in the head or neck. But generalized arterial tortuosity in the head and neck with no atherosclerosis or stenosis. 3. Cervical spine degeneration. Electronically Signed   By: Genevie Ann M.D.   On: 01/28/2019 20:59   Mr Brain Wo Contrast  Result Date: 01/28/2019 CLINICAL DATA:  TIA, confusion EXAM: MRI HEAD WITHOUT CONTRAST TECHNIQUE: Multiplanar, multiecho pulse sequences of the brain and surrounding structures were obtained without intravenous contrast. COMPARISON:  CT head 01/28/2019 FINDINGS: Brain: Negative for acute infarct. Subtle hypodensity left anterior thalamus likely represents chronic ischemia. Mild chronic ischemic changes also present in the cerebral white matter bilaterally. Chronic microhemorrhage in the left parietal lobe. Negative for mass or edema. Ventricle size normal. Vascular: Normal arterial flow voids. Skull and upper cervical spine: Negative Sinuses/Orbits: Negative Other: None IMPRESSION: Negative for acute infarct Mild chronic ischemic changes left thalamus and cerebral white matter bilaterally. Electronically Signed   By: Franchot Gallo M.D.   On: 01/28/2019 13:14    Medications:  I have reviewed the patient's current medications. Scheduled: . amLODipine  5 mg Oral Daily  . aspirin EC  81 mg Oral Daily  . enoxaparin (LOVENOX) injection  40 mg Subcutaneous Q24H  . gabapentin  300 mg Oral BID WC  . gabapentin  600 mg Oral QHS  . insulin aspart  0-5 Units Subcutaneous QHS  . insulin aspart  0-9 Units Subcutaneous TID WC  . metFORMIN  1,000 mg Oral BID WC  . metoprolol succinate  50 mg Oral Daily  . montelukast  10 mg Oral Daily  . pravastatin  40 mg Oral q1800  . sodium chloride flush  3 mL Intravenous Q12H    Assessment/Plan: 68 year old female presenting with episodes of lapses of memory.  No focal neurological complaints.   Etiology unclear.  MRI of the brain reviewed and shows no acute changes.  EEG unremarkable.  CTA with only minimal atherosclerosis.  ESR, TSH normal.  B12 low at 158.  Remaining labs pending.  Speech therapy revealed mild cognitive deficits.    Recommendations: 1. B12 supplementation with initial dose given parenteral and absorption deficiencies ruled out.  2. Patient to have full neuropsych testing on an outpatient basis with neurology follow up.       LOS: 0 days   Alexis Goodell, MD Neurology [phone] 01/29/2019  10:29 AM

## 2019-01-29 NOTE — Care Management Obs Status (Signed)
[place] NOTIFICATION   Patient Details  Name: Zee Pfuhl MRN: [id number] Date of Birth: 28-May-1950   Medicare Observation Status Notification Given:  Yes    Rual Vermeer A Jayziah Bankhead, RN 01/29/2019, 12:07 PM

## 2019-01-29 NOTE — Evaluation (Signed)
Speech Language Pathology Evaluation Patient Details Name: Alyssa Summers MRN: [id number] DOB: 08-21-1950 Today's Date: 01/29/2019 Time: [id number] SLP Time Calculation (min) (ACUTE ONLY): 35 min  Problem List:  Patient Active Problem List   Diagnosis Date Noted  . TIA (transient ischemic attack) 01/28/2019   Past Medical History:  Past Medical History:  Diagnosis Date  . Diabetes mellitus without complication ([place])   . Hypertension   . Tachycardia    Past Surgical History:  Past Surgical History:  Procedure Laterality Date  . lap choleycystecomy     HPI:  Alyssa Summers  is a 68 y.o. female with a known history of diabetes type 2 hypertension who presents with complaints of some memory trouble.  Also having trouble with speech.  She states that she forgets what she wants to say.  She otherwise denies any weakness.  CT scan of the head was negative in the emergency room.   Assessment / Plan / Recommendation Clinical Impression   Patient scored a 19/30 on the [facility] Cognitive [facility]), indicating mild-moderate cognitive-communication deficits. The subtest scores are as follows: Visuospatial/executive (3/5), Naming (3/3), Attention (3/5), Language (2/3), Abstraction (0/2), Delayed Recall (1/5), Orientation (6/6). Per patient report, patient's overall symptoms at time of admission have mostly resolved (I.e. speech issues, word-finding issues), however patient continues to complain of memory and processing speed deficits, which were evident during formal and informal assessment. She stated, "I still feel like I'm in a fog, like I'm not all the way there yet" and "I still have trouble remembering what occurred during the day." Neurologist arrived in the middle of the session and reported no acute neurologic findings on the CT or MRI with possibility of d/c today. Education provided to patient re: noted cognitive-linguistic deficits during today's evaluation and recommendation for f/u  speech therapy services in outpatient or home health setting for additional in-depth cognitive-communication evaluation/treatment. Patient verbalized understanding and interest in addressing cognitive-linguistic deficits further following d/c from hospital. At this time, SLP will place patient on current ST tx caseload for the time being with expectation of patient discharging most likely from [facility] today (per Neurologist report).      SLP Assessment  SLP Recommendation/Assessment: Patient needs continued Speech Lanaguage Pathology Services SLP Visit Diagnosis: Cognitive communication deficit (R41.841)    Follow Up Recommendations  Home health SLP;Outpatient SLP    Frequency and Duration min 2x/week  1 week      SLP Evaluation Cognition  Overall Cognitive Status: Impaired/Different from baseline Arousal/Alertness: Awake/alert Orientation Level: Oriented X4 Attention: Divided Divided Attention: Impaired Divided Attention Impairment: Verbal complex;Functional complex Memory: Impaired Memory Impairment: Decreased short term memory Decreased Short Term Memory: Verbal complex;Functional complex Awareness: Appears intact Problem Solving: Impaired Problem Solving Impairment: Functional complex Executive Function: Sequencing Sequencing: Impaired Sequencing Impairment: Functional complex Safety/Judgment: Appears intact       Comprehension  Auditory Comprehension Overall Auditory Comprehension: Appears within functional limits for tasks assessed Yes/No Questions: Within Functional Limits Commands: Within Functional Limits Conversation: Complex Interfering Components: Processing speed;Working Field seismologist: Barista: Within [employer] Reading Comprehension Reading Status: Not tested    Expression Expression Primary Mode of Expression: Verbal Verbal Expression Overall Verbal  Expression: Appears within functional limits for tasks assessed Initiation: No impairment Automatic Speech: Name;Social Response Level of Generative/Spontaneous Verbalization: Conversation Repetition: No impairment Naming: No impairment Pragmatics: No impairment Written Expression Dominant Hand: Right Written Expression: Not tested   Oral / Motor   N/A  Loni Beckwith, M.S. CCC-SLP  Speech-Language Pathologist                    Loni Beckwith 01/29/2019, 9:52 AM

## 2019-01-29 NOTE — Progress Notes (Signed)
PT Cancellation Note  Patient Details Name: Alyssa Summers MRN: [id number] DOB: 21-Jun-1950   Cancelled Treatment:    Reason Eval/Treat Not Completed: PT screened, no needs identified, will sign off(Patient consult received and reviewed. Upon entering patient room she states she has no deficits, is going to the bathroom fine, walking at baseline, no needs/wants for PT. Patient declines further PT services. Will sign off, please re-consult as needed.)  Janna Arch, PT, DPT   01/29/2019, 8:01 AM

## 2019-01-29 NOTE — Discharge Summary (Signed)
[facility] NAME: Alyssa Summers    MR#:  [id number]  DATE OF BIRTH:  1950/12/21  DATE OF ADMISSION:  01/28/2019 ADMITTING PHYSICIAN: Dustin Flock, MD  DATE OF DISCHARGE: 01/29/2019   PRIMARY CARE PHYSICIAN: Kirk Ruths, MD    ADMISSION DIAGNOSIS:  Transient alteration of awareness [R40.4]   DISCHARGE DIAGNOSIS:  Active Problems:   TIA (transient ischemic attack) Acute CVA ruled out.  SECONDARY DIAGNOSIS:   Past Medical History:  Diagnosis Date  . Diabetes mellitus without complication ([place])   . Hypertension   . Tachycardia     HOSPITAL COURSE:   1.  TIA This was suspected due to her presentation of word finding difficulties. She continued to have some difficulties with memory/words. MRI on brain is negative for acute finding.  Angiogram was done on head without any significant abnormality. Echocardiogram was done which did not show any significant abnormalities. LDL was 48.  TSH was 2.01. Vitamin B12 level 158-we will give 1 dose intramuscular injection and advised to continue taking oral tablets. Patient was advised to follow with neurology clinic as outpatient to have neuropsychiatric evaluation.  2. DM ssi  conitnue oral medication  3. HTN continue metoprolol and amlodopine  4. Neuropathy continue gabapentin  5. misc lovenox  6. nicotinue abuse smokinag cessation provided- 4 min spent, nicotine patch offred    DISCHARGE CONDITIONS:   Stable  CONSULTS OBTAINED:  Treatment Team:  Catarina Hartshorn, MD  DRUG ALLERGIES:   Allergies  Allergen Reactions  . Glipizide     DISCHARGE MEDICATIONS:   Allergies as of 01/29/2019      Reactions   Glipizide       Medication List    TAKE these medications   amLODipine 5 MG tablet Commonly known as: NORVASC Take 5 mg by mouth daily.   aspirin EC 81 MG tablet Take 81 mg by mouth daily.   gabapentin 300 MG capsule Commonly  known as: NEURONTIN Take 300-600 mg by mouth See admin instructions. Take 1 capsule (300mg ) by mouth every morning and daily at lunchtime then take 2 capsules (600mg ) by mouth every night at bedtime   Jardiance 25 MG Tabs tablet Generic drug: empagliflozin Take 25 mg by mouth daily.   lovastatin 40 MG tablet Commonly known as: MEVACOR Take 40 mg by mouth daily after supper.   metFORMIN 500 MG 24 hr tablet Commonly known as: GLUCOPHAGE-XR Take 1,000 mg by mouth 2 (two) times daily.   metoprolol succinate 50 MG 24 hr tablet Commonly known as: TOPROL-XL Take 50 mg by mouth daily.   montelukast 10 MG tablet Commonly known as: SINGULAIR Take 10 mg by mouth daily.   oxyCODONE 5 MG immediate release tablet Commonly known as: Roxicodone Take 1 tablet (5 mg total) by mouth every 6 (six) hours as needed.   vitamin B-12 1000 MCG tablet Commonly known as: CYANOCOBALAMIN Take 1 tablet (1,000 mcg total) by mouth daily.        DISCHARGE INSTRUCTIONS:    Follow with neurology clinic in 1 to 2 weeks.  If you experience worsening of your admission symptoms, develop shortness of breath, life threatening emergency, suicidal or homicidal thoughts you must seek medical attention immediately by calling 911 or calling your MD immediately  if symptoms less severe.  You Must read complete instructions/literature along with all the possible adverse reactions/side effects for all the Medicines you take and that have been prescribed to you. Take any  new Medicines after you have completely understood and accept all the possible adverse reactions/side effects.   Please note  You were cared for by a hospitalist during your hospital stay. If you have any questions about your discharge medications or the care you received while you were in the hospital after you are discharged, you can call the unit and asked to speak with the hospitalist on call if the hospitalist that took care of you is not available.  Once you are discharged, your primary care physician will handle any further medical issues. Please note that NO REFILLS for any discharge medications will be authorized once you are discharged, as it is imperative that you return to your primary care physician (or establish a relationship with a primary care physician if you do not have one) for your aftercare needs so that they can reassess your need for medications and monitor your lab values.    Today   CHIEF COMPLAINT:   Chief Complaint  Patient presents with  . Altered Mental Status    HISTORY OF PRESENT ILLNESS:  Alyssa Summers  is a 68 y.o. female with a known history diabetes type 2 hypertension who presents with complaints of some memory trouble.  Also having trouble with speech.  She states that she forgets what she wants to say.  She otherwise denies any weakness.  CT scan of the head was negative in the emergency room.   VITAL SIGNS:  Blood pressure 107/80, pulse 85, temperature 98.1 F (36.7 C), temperature source Oral, resp. rate 20, height 5\' 8"  (1.727 m), weight 73 kg, SpO2 96 %.  I/O:    Intake/Output Summary (Last 24 hours) at 01/29/2019 1252 Last data filed at 01/29/2019 0900 Gross per 24 hour  Intake 660 ml  Output -  Net 660 ml    PHYSICAL EXAMINATION:  GENERAL:  68 y.o.-year-old patient lying in the bed with no acute distress.  EYES: Pupils equal, round, reactive to light and accommodation. No scleral icterus. Extraocular muscles intact.  HEENT: Head atraumatic, normocephalic. Oropharynx and nasopharynx clear.  NECK:  Supple, no jugular venous distention. No thyroid enlargement, no tenderness.  LUNGS: Normal breath sounds bilaterally, no wheezing, rales,rhonchi or crepitation. No use of accessory muscles of respiration.  CARDIOVASCULAR: S1, S2 normal. No murmurs, rubs, or gallops.  ABDOMEN: Soft, non-tender, non-distended. Bowel sounds present. No organomegaly or mass.  EXTREMITIES: No pedal edema,  cyanosis, or clubbing.  NEUROLOGIC: Cranial nerves II through XII are intact. Muscle strength 5/5 in all extremities. Sensation intact. Gait not checked.  PSYCHIATRIC: The patient is alert and oriented x 3.  SKIN: No obvious rash, lesion, or ulcer.   DATA REVIEW:   CBC Recent Labs  Lab 01/28/19 0952  WBC 7.9  HGB 16.0*  HCT 47.8*  PLT 256    Chemistries  Recent Labs  Lab 01/28/19 0952  NA 137  K 4.0  CL 102  CO2 23  GLUCOSE 157*  BUN 9  CREATININE 0.69  CALCIUM 9.7  AST 21  ALT 26  ALKPHOS 58  BILITOT 1.1    Cardiac Enzymes No results for input(s): TROPONINI in the last 168 hours.  Microbiology Results  Results for orders placed or performed during the hospital encounter of 01/28/19  SARS CORONAVIRUS 2 (TAT 6-24 HRS) Nasopharyngeal Nasopharyngeal Swab     Status: None   Collection Time: 01/28/19 11:26 AM   Specimen: Nasopharyngeal Swab  Result Value Ref Range Status   SARS Coronavirus 2 NEGATIVE NEGATIVE Final  Comment: (NOTE) SARS-CoV-2 target nucleic acids are NOT DETECTED. The SARS-CoV-2 RNA is generally detectable in upper and lower respiratory specimens during the acute phase of infection. Negative results do not preclude SARS-CoV-2 infection, do not rule out co-infections with other pathogens, and should not be used as the sole basis for treatment or other patient management decisions. Negative results must be combined with clinical observations, patient history, and epidemiological information. The expected result is Negative. Fact Sheet for Patients: SugarRoll.be Fact Sheet for Healthcare Providers: https://www.woods-mathews.com/ This test is not yet approved or cleared by the [place] FDA and  has been authorized for detection and/or diagnosis of SARS-CoV-2 by FDA under an Emergency Use Authorization (EUA). This EUA will remain  in effect (meaning this test can be used) for the duration of  the COVID-19 declaration under Section 56 4(b)(1) of the Act, 21 U.S.C. section 360bbb-3(b)(1), unless the authorization is terminated or revoked sooner. Performed at [facility], [place] [address], [place], [place] [address]     RADIOLOGY:  Ct Angio Head W Or Wo Contrast  Result Date: 01/28/2019 CLINICAL DATA:  68 year old female with confusion. No acute findings on brain MRI earlier today. EXAM: CT ANGIOGRAPHY HEAD AND NECK TECHNIQUE: Multidetector CT imaging of the head and neck was performed using the standard protocol during bolus administration of intravenous contrast. Multiplanar CT image reconstructions and MIPs were obtained to evaluate the vascular anatomy. Carotid stenosis measurements (when applicable) are obtained utilizing NASCET criteria, using the distal internal carotid diameter as the denominator. CONTRAST:  53mL OMNIPAQUE IOHEXOL 350 MG/ML SOLN COMPARISON:  Brain MRI and plain head CT earlier today. FINDINGS: CTA NECK Skeleton: Absent dentition. Cervical spine degeneration with vacuum disc and reversal of lordosis. No acute osseous abnormality identified. Upper chest: Negative. Other neck: No acute findings in the neck. Aortic arch: No arch atherosclerosis. Three vessel arch configuration. Right carotid system: Mildly tortuous brachiocephalic artery. Tortuous proximal right CCA. Negative right carotid bifurcation. Highly tortuous cervical right ICA at the C1 level without stenosis. Left carotid system: Mild plaque at the left CCA origin without stenosis. Tortuous mid left CCA. Negative left carotid bifurcation. Highly tortuous cervical left ICA similar to the right side without stenosis. Vertebral arteries: Tortuous proximal right subclavian artery without plaque or stenosis. Normal right vertebral artery origin. Mildly tortuous right vertebral to the skull base without plaque or stenosis. Mild plaque at the left subclavian artery origin without stenosis. Mildly tortuous proximal  left subclavian. Normal left vertebral artery origin. Tortuous left vertebral artery to the skull base without stenosis. CTA HEAD Posterior circulation: Codominant distal vertebral arteries without stenosis. Tortuous but otherwise negative vertebrobasilar junction. Normal left PICA origin. Dominant appearing right AICA origin. Tortuous basilar artery is patent to the tip without stenosis. Normal SCA origins. Fetal type bilateral PCA origins. Bilateral PCA branches are within normal limits. Anterior circulation: Both ICA siphons are patent with minimal calcified plaque, tortuosity, but no stenosis. Normal left ophthalmic and posterior communicating artery origins. Normal right ophthalmic and posterior communicating artery origins. Patent carotid termini. Normal MCA and ACA origins. Tortuous A1 segments. Diminutive or absent anterior communicating artery. Bilateral ACA branches are within normal limits. Left MCA M1 and bifurcation are patent with tortuosity but without stenosis. Left MCA branches are within normal limits. Right MCA M1 segment is tortuous. Right MCA bifurcation is patent without stenosis. Right MCA branches are within normal limits. Venous sinuses: Early contrast timing but grossly patent. Anatomic variants: Fetal type PCA origins. Review of the  MIP images confirms the above findings IMPRESSION: 1. Negative for large vessel occlusion. 2. Minimal atherosclerosis and no arterial stenosis in the head or neck. But generalized arterial tortuosity in the head and neck with no atherosclerosis or stenosis. 3. Cervical spine degeneration. Electronically Signed   By: Genevie Ann M.D.   On: 01/28/2019 20:59   Dg Chest 2 View  Result Date: 01/28/2019 CLINICAL DATA:  Altered mental status. EXAM: CHEST - 2 VIEW COMPARISON:  08/14/2018 FINDINGS: The heart size and mediastinal contours are within normal limits. Both lungs are clear. The visualized skeletal structures are unremarkable. IMPRESSION: No active  cardiopulmonary disease. Electronically Signed   By: Marlaine Hind M.D.   On: 01/28/2019 09:34   Ct Head Wo Contrast  Result Date: 01/28/2019 CLINICAL DATA:  Altered level of consciousness/confusion EXAM: CT HEAD WITHOUT CONTRAST TECHNIQUE: Contiguous axial images were obtained from the base of the skull through the vertex without intravenous contrast. COMPARISON:  None. FINDINGS: Brain: Ventricles and sulci are within normal limits for age. There is no intracranial mass, hemorrhage, extra-axial fluid collection, or midline shift. There is slight small vessel disease in the centra semiovale bilaterally. Elsewhere brain parenchyma appears unremarkable. No acute infarct is evident. Vascular: There is no hyperdense vessel. There is no appreciable vascular calcification. Skull: The bony calvarium appears intact. Sinuses/Orbits: Visualized paranasal sinuses are clear. Visualized orbits appear symmetric bilaterally. Other: Mastoid air cells are clear. IMPRESSION: Slight periventricular small vessel disease. No demonstrable acute infarct. No mass or hemorrhage. Electronically Signed   By: Lowella Grip III M.D.   On: 01/28/2019 09:36   Ct Angio Neck W Or Wo Contrast  Result Date: 01/28/2019 CLINICAL DATA:  68 year old female with confusion. No acute findings on brain MRI earlier today. EXAM: CT ANGIOGRAPHY HEAD AND NECK TECHNIQUE: Multidetector CT imaging of the head and neck was performed using the standard protocol during bolus administration of intravenous contrast. Multiplanar CT image reconstructions and MIPs were obtained to evaluate the vascular anatomy. Carotid stenosis measurements (when applicable) are obtained utilizing NASCET criteria, using the distal internal carotid diameter as the denominator. CONTRAST:  50mL OMNIPAQUE IOHEXOL 350 MG/ML SOLN COMPARISON:  Brain MRI and plain head CT earlier today. FINDINGS: CTA NECK Skeleton: Absent dentition. Cervical spine degeneration with vacuum disc and  reversal of lordosis. No acute osseous abnormality identified. Upper chest: Negative. Other neck: No acute findings in the neck. Aortic arch: No arch atherosclerosis. Three vessel arch configuration. Right carotid system: Mildly tortuous brachiocephalic artery. Tortuous proximal right CCA. Negative right carotid bifurcation. Highly tortuous cervical right ICA at the C1 level without stenosis. Left carotid system: Mild plaque at the left CCA origin without stenosis. Tortuous mid left CCA. Negative left carotid bifurcation. Highly tortuous cervical left ICA similar to the right side without stenosis. Vertebral arteries: Tortuous proximal right subclavian artery without plaque or stenosis. Normal right vertebral artery origin. Mildly tortuous right vertebral to the skull base without plaque or stenosis. Mild plaque at the left subclavian artery origin without stenosis. Mildly tortuous proximal left subclavian. Normal left vertebral artery origin. Tortuous left vertebral artery to the skull base without stenosis. CTA HEAD Posterior circulation: Codominant distal vertebral arteries without stenosis. Tortuous but otherwise negative vertebrobasilar junction. Normal left PICA origin. Dominant appearing right AICA origin. Tortuous basilar artery is patent to the tip without stenosis. Normal SCA origins. Fetal type bilateral PCA origins. Bilateral PCA branches are within normal limits. Anterior circulation: Both ICA siphons are patent with minimal calcified plaque, tortuosity, but no  stenosis. Normal left ophthalmic and posterior communicating artery origins. Normal right ophthalmic and posterior communicating artery origins. Patent carotid termini. Normal MCA and ACA origins. Tortuous A1 segments. Diminutive or absent anterior communicating artery. Bilateral ACA branches are within normal limits. Left MCA M1 and bifurcation are patent with tortuosity but without stenosis. Left MCA branches are within normal limits. Right MCA  M1 segment is tortuous. Right MCA bifurcation is patent without stenosis. Right MCA branches are within normal limits. Venous sinuses: Early contrast timing but grossly patent. Anatomic variants: Fetal type PCA origins. Review of the MIP images confirms the above findings IMPRESSION: 1. Negative for large vessel occlusion. 2. Minimal atherosclerosis and no arterial stenosis in the head or neck. But generalized arterial tortuosity in the head and neck with no atherosclerosis or stenosis. 3. Cervical spine degeneration. Electronically Signed   By: Genevie Ann M.D.   On: 01/28/2019 20:59   Mr Brain Wo Contrast  Result Date: 01/28/2019 CLINICAL DATA:  TIA, confusion EXAM: MRI HEAD WITHOUT CONTRAST TECHNIQUE: Multiplanar, multiecho pulse sequences of the brain and surrounding structures were obtained without intravenous contrast. COMPARISON:  CT head 01/28/2019 FINDINGS: Brain: Negative for acute infarct. Subtle hypodensity left anterior thalamus likely represents chronic ischemia. Mild chronic ischemic changes also present in the cerebral white matter bilaterally. Chronic microhemorrhage in the left parietal lobe. Negative for mass or edema. Ventricle size normal. Vascular: Normal arterial flow voids. Skull and upper cervical spine: Negative Sinuses/Orbits: Negative Other: None IMPRESSION: Negative for acute infarct Mild chronic ischemic changes left thalamus and cerebral white matter bilaterally. Electronically Signed   By: Franchot Gallo M.D.   On: 01/28/2019 13:14    EKG:   Orders placed or performed during the hospital encounter of 01/28/19  . ED EKG  . ED EKG  . EKG 12-Lead  . EKG 12-Lead      Management plans discussed with the patient, family and they are in agreement.  CODE STATUS:     Code Status Orders  (From admission, onward)         Start     Ordered   01/28/19 1201  Full code  Continuous     01/28/19 1200        Code Status History    This patient has a current code status  but no historical code status.   Advance Care Planning Activity      TOTAL TIME TAKING CARE OF THIS PATIENT: 35 minutes.    Vaughan Basta M.D on 01/29/2019 at 12:52 PM  Between 7am to 6pm - Pager - [phone]  After 6pm go to [email or web address] - password [email or web address] [facility]  Office  [phone]  CC: Primary care physician; Kirk Ruths, MD   Note: This dictation was prepared with Dragon dictation along with smaller phrase technology. Any transcriptional errors that result from this process are unintentional.

## 2019-01-29 NOTE — Progress Notes (Signed)
Oral and written AVS discharge instructions given. Stroke education completed with emphasis on tobacco cessation, low fat diet; continue her regular walking exercise. Discharge home to self/family care. Pt transported in transport chair to private vehicle.

## 2019-01-31 LAB — HEMOGLOBIN A1C
Hgb A1c MFr Bld: 6.2 % — ABNORMAL HIGH (ref 4.8–5.6)
Mean Plasma Glucose: 131 mg/dL

## 2019-01-31 LAB — HIV ANTIBODY (ROUTINE TESTING W REFLEX): HIV Screen 4th Generation wRfx: NONREACTIVE

## 2019-02-02 LAB — VITAMIN B1: Vitamin B1 (Thiamine): 71 nmol/L (ref 66.5–200.0)

## 2019-02-02 LAB — PROLACTIN: Prolactin: 15 ng/mL (ref 4.8–23.3)

## 2019-04-12 ENCOUNTER — Emergency Department: Payer: Medicare Other

## 2019-04-12 ENCOUNTER — Encounter: Payer: Self-pay | Admitting: Emergency Medicine

## 2019-04-12 ENCOUNTER — Other Ambulatory Visit: Payer: Self-pay

## 2019-04-12 ENCOUNTER — Emergency Department
Admission: EM | Admit: 2019-04-12 | Discharge: 2019-04-12 | Disposition: A | Discharge status: Home / Self Care | Payer: Medicare Other | Attending: Student in an Organized Health Care Education/Training Program | Admitting: Student in an Organized Health Care Education/Training Program

## 2019-04-12 DIAGNOSIS — Y939 Activity, unspecified: Secondary | ICD-10-CM | POA: Insufficient documentation

## 2019-04-12 DIAGNOSIS — E119 Type 2 diabetes mellitus without complications: Secondary | ICD-10-CM | POA: Diagnosis not present

## 2019-04-12 DIAGNOSIS — S6991XA Unspecified injury of right wrist, hand and finger(s), initial encounter: Secondary | ICD-10-CM | POA: Insufficient documentation

## 2019-04-12 DIAGNOSIS — Y929 Unspecified place or not applicable: Secondary | ICD-10-CM | POA: Insufficient documentation

## 2019-04-12 DIAGNOSIS — Z7984 Long term (current) use of oral hypoglycemic drugs: Secondary | ICD-10-CM | POA: Diagnosis not present

## 2019-04-12 DIAGNOSIS — F17210 Nicotine dependence, cigarettes, uncomplicated: Secondary | ICD-10-CM | POA: Insufficient documentation

## 2019-04-12 DIAGNOSIS — R1084 Generalized abdominal pain: Secondary | ICD-10-CM

## 2019-04-12 DIAGNOSIS — W010XXA Fall on same level from slipping, tripping and stumbling without subsequent striking against object, initial encounter: Secondary | ICD-10-CM | POA: Insufficient documentation

## 2019-04-12 DIAGNOSIS — Y999 Unspecified external cause status: Secondary | ICD-10-CM | POA: Insufficient documentation

## 2019-04-12 DIAGNOSIS — Z8673 Personal history of transient ischemic attack (TIA), and cerebral infarction without residual deficits: Secondary | ICD-10-CM | POA: Diagnosis not present

## 2019-04-12 DIAGNOSIS — Z7982 Long term (current) use of aspirin: Secondary | ICD-10-CM | POA: Insufficient documentation

## 2019-04-12 DIAGNOSIS — I10 Essential (primary) hypertension: Secondary | ICD-10-CM | POA: Insufficient documentation

## 2019-04-12 DIAGNOSIS — Z79899 Other long term (current) drug therapy: Secondary | ICD-10-CM | POA: Insufficient documentation

## 2019-04-12 LAB — URINALYSIS, COMPLETE (UACMP) WITH MICROSCOPIC
Bilirubin Urine: NEGATIVE
Glucose, UA: >=500 mg/dL — AB
Hgb urine dipstick: NEGATIVE
Ketones, ur: 5 mg/dL — AB
Leukocytes,Ua: NEGATIVE
Nitrite: NEGATIVE
Protein, ur: NEGATIVE mg/dL
Specific Gravity, Urine: 1.031 — ABNORMAL HIGH (ref 1.005–1.030)
pH: 5 (ref 5.0–8.0)

## 2019-04-12 LAB — CBC
HCT: 46.6 % — ABNORMAL HIGH (ref 36.0–46.0)
Hemoglobin: 15.5 g/dL — ABNORMAL HIGH (ref 12.0–15.0)
MCH: 28.9 pg (ref 26.0–34.0)
MCHC: 33.3 g/dL (ref 30.0–36.0)
MCV: 86.8 fL (ref 80.0–100.0)
Platelets: 270 10*3/uL (ref 150–400)
RBC: 5.37 MIL/uL — ABNORMAL HIGH (ref 3.87–5.11)
RDW: 15 % (ref 11.5–15.5)
WBC: 8.6 10*3/uL (ref 4.0–10.5)
nRBC: 0 % (ref 0.0–0.2)

## 2019-04-12 LAB — COMPREHENSIVE METABOLIC PANEL
ALT: 15 U/L (ref 0–44)
AST: 17 U/L (ref 15–41)
Albumin: 4.4 g/dL (ref 3.5–5.0)
Alkaline Phosphatase: 61 U/L (ref 38–126)
Anion gap: 12 (ref 5–15)
BUN: 11 mg/dL (ref 8–23)
CO2: 22 mmol/L (ref 22–32)
Calcium: 9.4 mg/dL (ref 8.9–10.3)
Chloride: 104 mmol/L (ref 98–111)
Creatinine, Ser: 0.74 mg/dL (ref 0.44–1.00)
GFR calc Af Amer: >60 mL/min (ref >60)
GFR calc non Af Amer: >60 mL/min (ref >60)
Glucose, Bld: 144 mg/dL — ABNORMAL HIGH (ref 70–99)
Potassium: 3.5 mmol/L (ref 3.5–5.1)
Sodium: 138 mmol/L (ref 135–145)
Total Bilirubin: 0.7 mg/dL (ref 0.3–1.2)
Total Protein: 7.6 g/dL (ref 6.5–8.1)

## 2019-04-12 LAB — LIPASE, BLOOD: Lipase: 40 U/L (ref 11–51)

## 2019-04-12 IMAGING — CR DG HAND 2V*R*
2 series · 2 of 2 positions shown · non-contrast
Comparison: None.

CLINICAL DATA: Right long and ring finger pain since fall 10 days
ago.

EXAM:
RIGHT HAND - 2 VIEW

[hand ap]
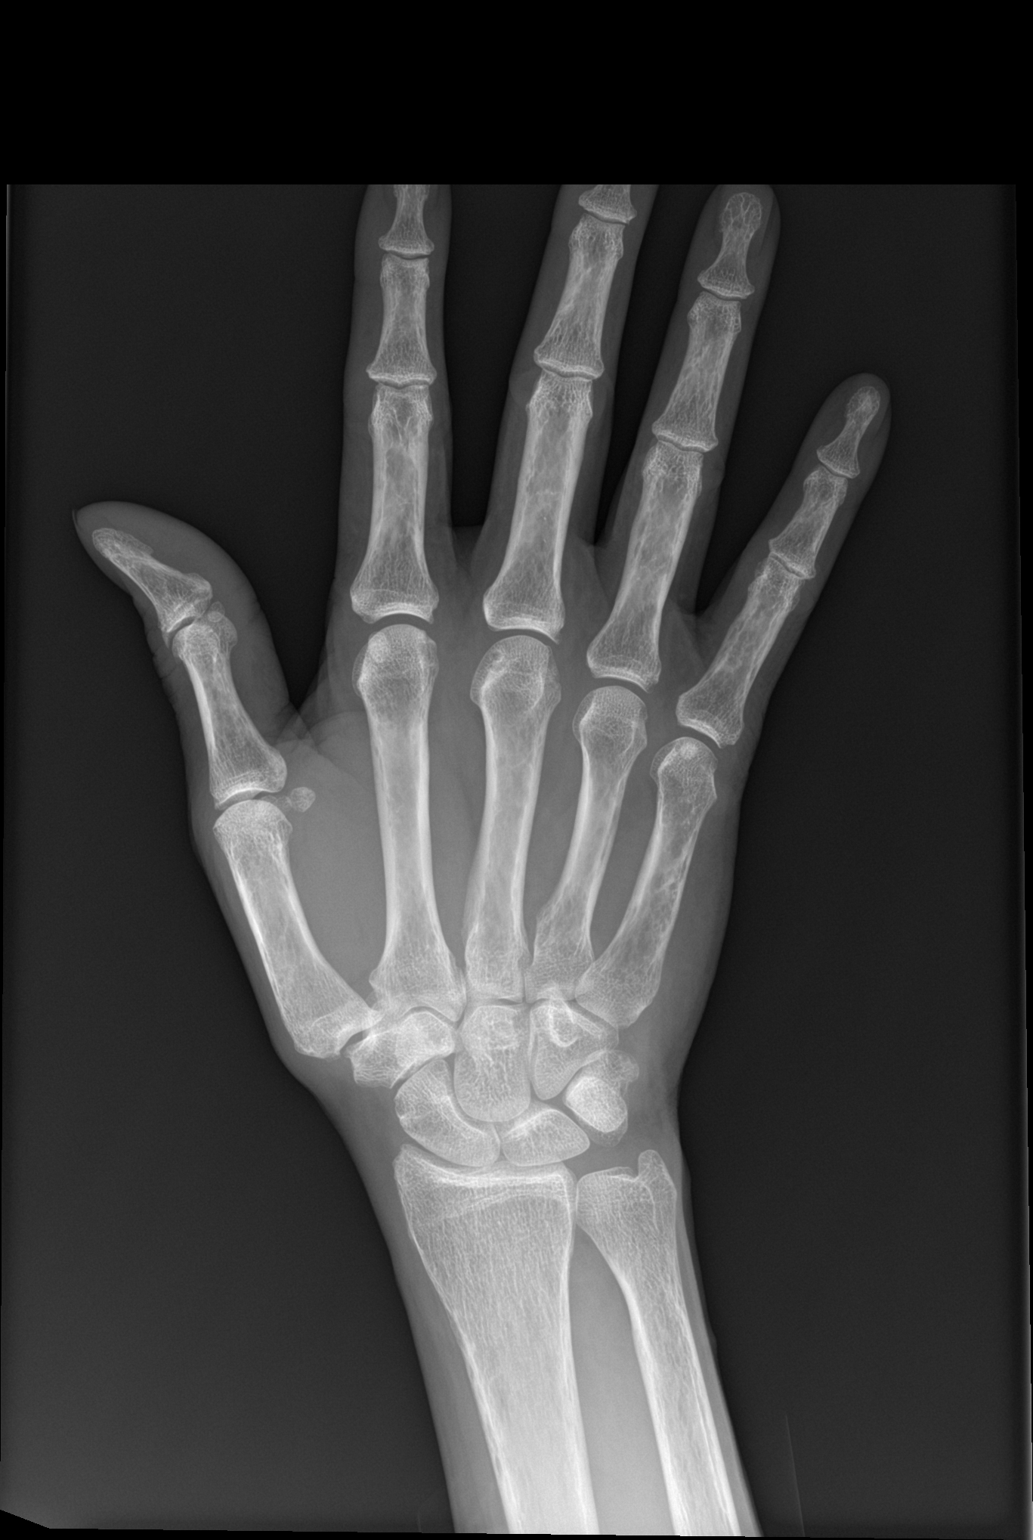

[hand lat]
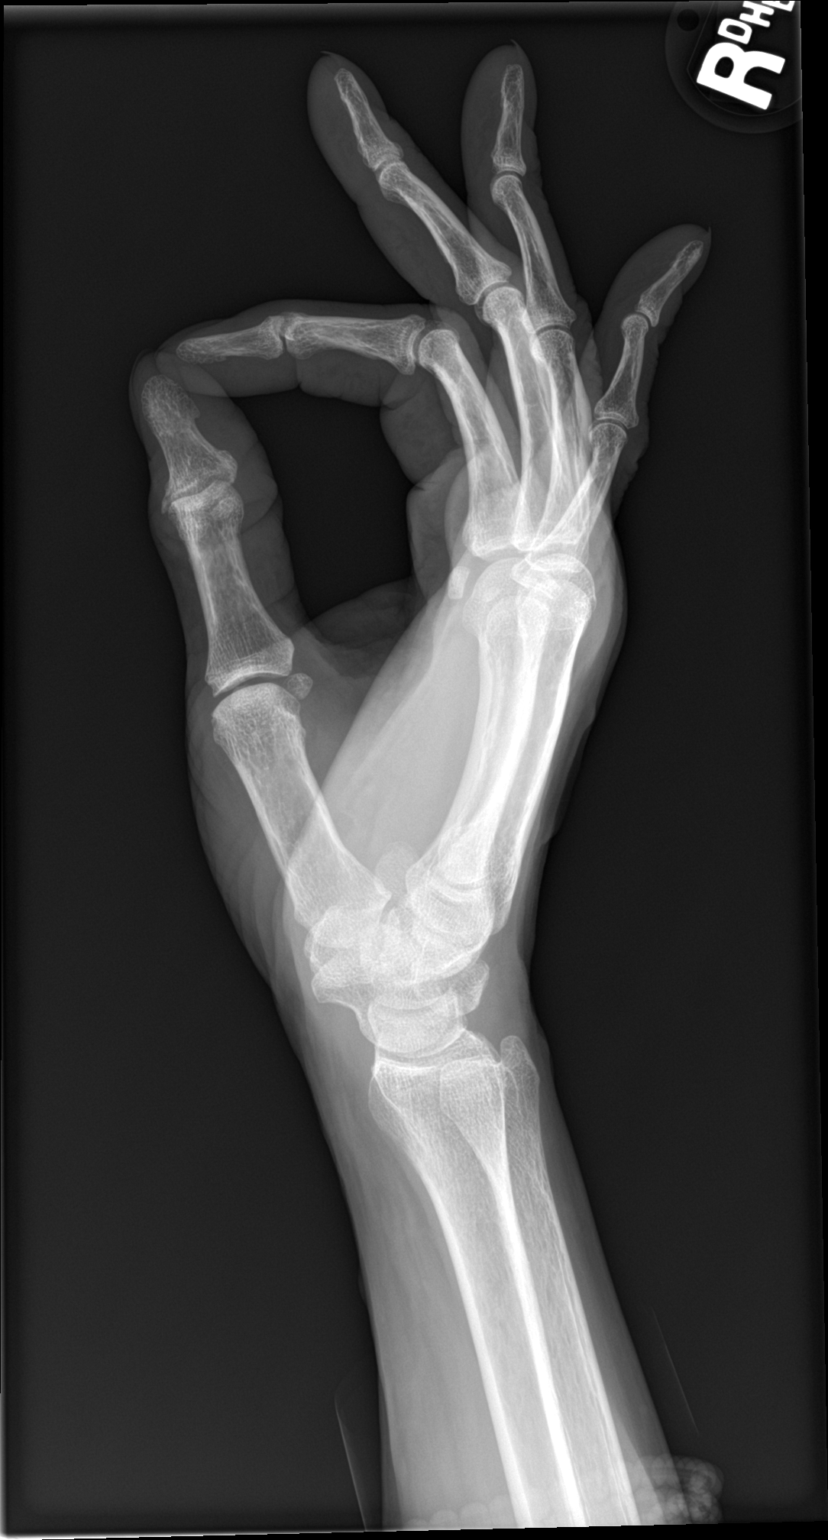

[2 of 2 positions shown; findings below may reference images not displayed]

FINDINGS: No acute fracture or dislocation. Joint spaces are preserved. Small
marginal osteophytes at the second and third MCP joints and first
CMC joint. Bone mineralization is normal. Soft tissues are
unremarkable.
IMPRESSION: No acute osseous abnormality.

## 2019-04-12 MED ORDER — CEPHALEXIN 500 MG PO CAPS
500.0000 mg | ORAL_CAPSULE | Freq: Once | ORAL | Status: AC
  Administered 2019-04-12: 500 mg via ORAL
  Filled 2019-04-12: qty 1

## 2019-04-12 MED ORDER — CEPHALEXIN 500 MG PO CAPS: 500.0000 mg | ORAL_CAPSULE | Freq: Three times a day (TID) | ORAL | 0 refills | Status: AC

## 2019-04-12 MED ORDER — HYDROCODONE-ACETAMINOPHEN 5-325 MG PO TABS
1.0000 | ORAL_TABLET | Freq: Once | ORAL | Status: AC
  Administered 2019-04-12: 1 via ORAL
  Filled 2019-04-12: qty 1

## 2019-04-12 MED ORDER — HYDROCODONE-ACETAMINOPHEN 5-325 MG PO TABS: 1.0000 | ORAL_TABLET | ORAL | 0 refills | Status: DC | PRN

## 2019-04-12 NOTE — ED Triage Notes (Signed)
Pt reports she fell on Saturday the 14th and has been having pain since then. Pt c/o pain to right side abd, left and right hand and both legs. Pt reports she bent over to pick up her cigarette and had been drinking and she fell forwards. Pt denies LOC.

## 2019-04-12 NOTE — Discharge Instructions (Addendum)

## 2019-04-12 NOTE — ED Provider Notes (Signed)
[facility] Emergency Department Provider Note    First MD Initiated Contact with Patient 04/12/19 1853     (approximate)  I have reviewed the triage vital signs and the nursing notes.   HISTORY  Chief Complaint Fall and Abdominal Pain    HPI Alyssa Summers is a 68 y.o. female below listed past medical history presents the ER for more than 2 weeks of back pain abdominal pain as well as  pain of right third and fourth digits status post mechanical fall on the 14th of this month while she was having a party and admits to being intoxicated.  Denies any head injury.  No neck pain.  Denies any chest pain or shortness of breath.  States that she feels like she pulled a muscle but is still having pain.  Has not take anything for pain at home.  Denies any nausea vomiting or diarrhea.  No fevers.     Past Medical History:  Diagnosis Date   Diabetes mellitus without complication (HCC)    Hypertension    Tachycardia    Family History  Problem Relation Age of Onset   Hypertension Mother    Past Surgical History:  Procedure Laterality Date   lap choleycystecomy     Patient Active Problem List   Diagnosis Date Noted   TIA (transient ischemic attack) 01/28/2019      Prior to Admission medications   Medication Sig Start Date End Date Taking? Authorizing Provider  amLODipine (NORVASC) 5 MG tablet Take 5 mg by mouth daily. 11/19/18   [provider]  aspirin EC 81 MG tablet Take 81 mg by mouth daily. 09/23/18   [provider]  gabapentin (NEURONTIN) 300 MG capsule Take 300-600 mg by mouth See admin instructions. Take 1 capsule (300mg ) by mouth every morning and daily at lunchtime then take 2 capsules (600mg ) by mouth every night at bedtime 12/01/18   [provider]  JARDIANCE 25 MG TABS tablet Take 25 mg by mouth daily. 10/25/18   [provider]  lovastatin (MEVACOR) 40 MG tablet Take 40 mg by mouth daily after supper. 09/23/18    [provider]  metFORMIN (GLUCOPHAGE-XR) 500 MG 24 hr tablet Take 1,000 mg by mouth 2 (two) times daily. 11/16/18   [provider]  metoprolol succinate (TOPROL-XL) 50 MG 24 hr tablet Take 50 mg by mouth daily. 09/24/18   [provider]  montelukast (SINGULAIR) 10 MG tablet Take 10 mg by mouth daily. 10/22/18   [provider]  vitamin B-12 (CYANOCOBALAMIN) 1000 MCG tablet Take 1 tablet (1,000 mcg total) by mouth daily. 01/29/19   Vaughan Basta, MD    Allergies Glipizide    Social History Social History   Tobacco Use   Smoking status: Current Every Day Smoker    Packs/day: 0.50    Types: Cigarettes   Smokeless tobacco: Never Used  Substance Use Topics   Alcohol use: Yes   Drug use: Never    Review of Systems Patient denies headaches, rhinorrhea, blurry vision, numbness, shortness of breath, chest pain, edema, cough, abdominal pain, nausea, vomiting, diarrhea, dysuria, fevers, rashes or hallucinations unless otherwise stated above in HPI. ____________________________________________   PHYSICAL EXAM:  VITAL SIGNS: Vitals:   04/12/19 1607  BP: 130/87  Pulse: 94  Resp: 20  Temp: 99 F (37.2 C)  SpO2: 97%    Constitutional: Alert and oriented.  Eyes: Conjunctivae are normal.  Head: Atraumatic. Nose: No congestion/rhinnorhea. Mouth/Throat: Mucous membranes are moist.  Neck: No stridor. Painless ROM.  Cardiovascular: Normal rate, regular rhythm. Grossly normal heart sounds.  Good peripheral circulation. Respiratory: Normal respiratory effort.  No retractions. Lungs CTAB. Gastrointestinal: Soft and nontender to deep palpation in all four quadrants No distention. No pulsatile mass, No abdominal bruits. No CVA tenderness. Genitourinary: deferred Musculoskeletal: No lower extremity tenderness nor edema.  No joint effusions. Neurologic:  Normal speech and language. No gross focal neurologic deficits are appreciated. No facial  droop Skin:  Skin is warm, dry and intact. No rash noted. Psychiatric: Mood and affect are normal. Speech and behavior are normal.  ____________________________________________   LABS (all labs ordered are listed, but only abnormal results are displayed)  Results for orders placed or performed during the hospital encounter of 04/12/19 (from the past 24 hour(s))  Urinalysis, Complete w Microscopic     Status: Abnormal   Collection Time: 04/12/19  4:09 PM  Result Value Ref Range   Color, Urine YELLOW (A) YELLOW   APPearance CLEAR (A) CLEAR   Specific Gravity, Urine 1.031 (H) 1.005 - 1.030   pH 5.0 5.0 - 8.0   Glucose, UA >=500 (A) NEGATIVE mg/dL   Hgb urine dipstick NEGATIVE NEGATIVE   Bilirubin Urine NEGATIVE NEGATIVE   Ketones, ur 5 (A) NEGATIVE mg/dL   Protein, ur NEGATIVE NEGATIVE mg/dL   Nitrite NEGATIVE NEGATIVE   Leukocytes,Ua NEGATIVE NEGATIVE   RBC / HPF 0-5 0 - 5 RBC/hpf   WBC, UA 0-5 0 - 5 WBC/hpf   Bacteria, UA MANY (A) NONE SEEN   Squamous Epithelial / LPF 0-5 0 - 5   Mucus PRESENT   Lipase, blood     Status: None   Collection Time: 04/12/19  4:10 PM  Result Value Ref Range   Lipase 40 11 - 51 U/L  Comprehensive metabolic panel     Status: Abnormal   Collection Time: 04/12/19  4:10 PM  Result Value Ref Range   Sodium 138 135 - 145 mmol/L   Potassium 3.5 3.5 - 5.1 mmol/L   Chloride 104 98 - 111 mmol/L   CO2 22 22 - 32 mmol/L   Glucose, Bld 144 (H) 70 - 99 mg/dL   BUN 11 8 - 23 mg/dL   Creatinine, Ser 0.74 0.44 - 1.00 mg/dL   Calcium 9.4 8.9 - 10.3 mg/dL   Total Protein 7.6 6.5 - 8.1 g/dL   Albumin 4.4 3.5 - 5.0 g/dL   AST 17 15 - 41 U/L   ALT 15 0 - 44 U/L   Alkaline Phosphatase 61 38 - 126 U/L   Total Bilirubin 0.7 0.3 - 1.2 mg/dL   GFR calc non Af Amer >60 >60 mL/min   GFR calc Af Amer >60 >60 mL/min   Anion gap 12 5 - 15  CBC     Status: Abnormal   Collection Time: 04/12/19  4:10 PM  Result Value Ref Range   WBC 8.6 4.0 - 10.5 K/uL   RBC 5.37 (H)  3.87 - 5.11 MIL/uL   Hemoglobin 15.5 (H) 12.0 - 15.0 g/dL   HCT 46.6 (H) 36.0 - 46.0 %   MCV 86.8 80.0 - 100.0 fL   MCH 28.9 26.0 - 34.0 pg   MCHC 33.3 30.0 - 36.0 g/dL   RDW 15.0 11.5 - 15.5 %   Platelets 270 150 - 400 K/uL   nRBC 0.0 0.0 - 0.2 %   ____________________________________________   ____________________________________________  RADIOLOGY  I personally reviewed all radiographic images ordered to evaluate for  the above acute complaints and reviewed radiology reports and findings.  These findings were personally discussed with the patient.  Please see medical record for radiology report.  ____________________________________________   PROCEDURES  Procedure(s) performed:  Procedures    Critical Care performed: no ____________________________________________   INITIAL IMPRESSION / ASSESSMENT AND PLAN / ED COURSE  Pertinent labs & imaging results that were available during my care of the patient were reviewed by me and considered in my medical decision making (see chart for details).   DDX: Fracture musculoskeletal strain, shingles, radiculopathy, hematoma, colitis, diverticulitis, appendicitis, UTI, stone contusion  Alyssa Summers is a 68 y.o. who presents to the ED with symptoms as described above.  Patient nontoxic-appearing able to ambulate with steady gait.  No focal neuro deficits.  Not several days after initial injury.  Given no head injury or neck injury do not feel that neuro imaging clinically indicated given her benign exam not on any anticoagulation.  X-ray ordered as she does have a evidence of low back pain after fall as well as some right hand pain no evidence of fracture by radiographs.  Her blood work is reassuring.  She has a completely benign abdominal exam.  Does have many bacteria on her urine and therefore will cover with antibiotics as may be this is some referred pain from cystitis.  Will send for culture.  I do not feel patient requires  hospitalization or further diagnostic imaging based on her current presentation.  Have discussed with the patient and available family all diagnostics and treatments performed thus far and all questions were answered to the best of my ability. The patient demonstrates understanding and agreement with plan.      The patient was evaluated in Emergency Department today for the symptoms described in the history of present illness. He/she was evaluated in the context of the global COVID-19 pandemic, which necessitated consideration that the patient might be at risk for infection with the SARS-CoV-2 virus that causes COVID-19. Institutional protocols and algorithms that pertain to the evaluation of patients at risk for COVID-19 are in a state of rapid change based on information released by regulatory bodies including the CDC and federal and state organizations. These policies and algorithms were followed during the patient's care in the ED.  As part of my medical decision making, I reviewed the following data within the [place] notes reviewed and incorporated, Labs reviewed, notes from prior ED visits and  Controlled Substance Database   ____________________________________________   FINAL CLINICAL IMPRESSION(S) / ED DIAGNOSES  Final diagnoses:  Generalized abdominal pain  Injury of head, initial encounter      NEW MEDICATIONS STARTED DURING THIS VISIT:  New Prescriptions   No medications on file     Note:  This document was prepared using Dragon voice recognition software and may include unintentional dictation errors.    Merlyn Lot, MD 04/12/19 2009

## 2019-10-13 ENCOUNTER — Ambulatory Visit (INDEPENDENT_AMBULATORY_CARE_PROVIDER_SITE_OTHER): Payer: Medicare Other | Admitting: Dermatology

## 2019-10-13 ENCOUNTER — Other Ambulatory Visit: Payer: Self-pay

## 2019-10-13 DIAGNOSIS — L209 Atopic dermatitis, unspecified: Secondary | ICD-10-CM

## 2019-10-13 MED ORDER — MOMETASONE FUROATE 0.1 % EX CREA: TOPICAL_CREAM | CUTANEOUS | 2 refills | Status: DC

## 2019-10-13 NOTE — Progress Notes (Signed)
   New Patient Visit  Subjective  Alyssa Summers is a 69 y.o. female who presents for the following: itchy rash (arms and legs - tiny bumps that appeared and started spreading in early March. She went to her PCP and was prescribed Prednisone and it went away but then returned one month later. She was again prescribed Prednisone and an anti itch cream and it resolved again but then came back. ).  The following portions of the chart were reviewed this encounter and updated as appropriate:  Tobacco  Allergies  Meds  Problems  Med Hx  Surg Hx  Fam Hx     Review of Systems:  No other skin or systemic complaints except as noted in HPI or Assessment and Plan.  Objective  Well appearing patient in no apparent distress; mood and affect are within normal limits.  A focused examination was performed including the arms and legs. Relevant physical exam findings are noted in the Assessment and Plan.  Objective  Extremities: Scaly erythematous papules and patches +/- dyspigmentation, lichenification, excoriations.    Assessment & Plan    Atopic dermatitis Extremities  May worsen during times of stress and cooler weather and is a chronic and recurrent condition -  Recommend mild soaps and moisturizers like Dove Sensitive Skin soaps and CeraVe cream.  Start Mometasone cream 0.1% apply to aa's BID x 2 weeks. Then decrease to 5d/wk PRN.  Plan to switch to Eucrisa 2% ointment at follow up appointment. Consider biopsy if not improved at follow up appointment.  mometasone (ELOCON) 0.1 % cream - Extremities  Return in about 2 months (around 12/13/2019).  Luther Redo, CMA, am acting as scribe for Sarina Ser, MD .  Documentation: I have reviewed the above documentation for accuracy and completeness, and I agree with the above.  Sarina Ser, MD

## 2019-10-13 NOTE — Patient Instructions (Signed)
Atopic Dermatitis  "Dermatitis" means inflammation of the skin.  "Atopic" dermatitis is a particular type of skin inflammation that is marked by dryness, associated itching, and a characteristic pattern of rash on the body.  The condition is fairly common and may occur in as many as 10% of children.  You will often hear it called "atopic eczema" or sometimes just "eczema".  The exact cause of atopic dermatitis is unknown.  In many patients, there is a family history of hay fever, asthma, or atopic dermatitis itself.  Rarely, atopic dermatitis in infants may be related to food sensitivity, such as sensitivity to milk, but this is often difficult to determine and manage.  In the majority of cases, however, no allergic triggers can be found.  Physical or emotional stressors (severe seasonal allergies, physical illness, etc.) can worsen atopic dermatitis.  Atopic dermatitis usually starts in infancy from the ages of 2 to 6 months.  The skin is dry and the rash is quite itchy, so infants may be restless and rub against the sheets or scratch (if able).  The rash may involve the face or it may cover a large part of the body.  As the child gets older, the rash may become more localized.  In early childhood, the rash is commonly on the legs, feet, hands and arms.  As a child becomes older, the rash may be limited to the bend of the elbows, knees, on the back of the hands, feet, and on the neck and face.  When the rash becomes more established, the dry itchy skin may become thickened, leathery and sometimes darker in coloration.  The more the person scratches, the worse the rash is and the thicker the skin gets.  Many children with atopic dermatitis outgrow the condition before school age, while others continue to have problems into adolescence and adulthood.  Many things may affect the severity of the condition.  All patients have sensitive and dry skin.  Many will find that during the winter months when the humidity  is very low, the dryness and itchiness will be worse.  On the other hand, some people are easily irritated by sweat and will find that they have more problems during the summer months.  Most patients note an increase in itching at times when there are sudden changes in temperature.  Other irritants easily affect the skin of a patient with atopic dermatitis.  Use of harsh soaps or detergents and exposure to wool are common problems.  Sometimes atopic dermatitis may become infected by bacteria, yeast or viruses.  This is called "secondary infection".  Bacterial secondary infection is the most common and is often a result of scratching.  The rash gets very red with pus-filled pimples and scabs.  If this occurs, your doctor will prescribe an antibiotic to control the infection.  A more serious complication can be caused by certain viruses.  The "cold sore" virus (herpes simplex) may cause a severe rash.  If this is suspected, immediately contact your doctor.   What can I expect from treatment? Unfortunately, there is no "magic" cure that will always eliminate atopic dermatitis.  The main objective in treating atopic dermatitis is to decrease the skin eruption and relieve the itching.  There are a number of different forms of the medications that are used for atopic dermatitis.  Primarily, topical medications will be used.  Because the skin is excessively dry, moisturizers will be recommended that will effectively decrease the dryness.  Daily bathing is  a useful way to get water into the skin but bathing should be brief (no more than 10 minutes unless otherwise indicated by your physician). ° °Effective moisturizers (Cetaphil cream or lotion, CeraVe cream or lotion [[employer], [employer], and [employer]], Aquaphor, and plain Vaseline) can be used immediately after the bath or shower to trap moisture within the skin.  It is best to “pat dry” after a bathing and then place your moisturizer (cream or lotion) on your skin.   Cortisone (steroid) is a medicated ointment or cream (eg. triamcinolone, hydrocortisone, desonide, betamethasone, clobetasol) that may also be suggested.  It is very helpful in decreasing the itching and controlling the inflammation.  Your doctor will prescribe a cortisone treatment that is most appropriate for the severity and location of the dermatitis that is to be treated.  ° °Once the affected area clears up, it is best to discontinue the use of the cortisone preparation due to possibility of atrophy (skin thinning), but continue the regular use of moisturizers to try to prevent new areas of dermatitis from occurring.  Of course, if itching or a new rash begins, the cortisone preparation may have to be started again.  Anti-inflammatory creams and ointments which are not steroids such as Protopic and Elidel may also be prescribed. ° °Certain internal medicines called antihistamines (eg. Atarax, Benadryl, hydroxyzine) may help control itching.  They primarily help with the itching by introducing some drowsiness and allowing you to sleep at night.  Some oral antibiotics are often useful as well for controlling the secondary infection and enable infected dermatitis to be controlled. ° °Other important forms of treatment: °1. Avoid contact with substances you know to cause itching.  These may include soaps, detergents, certain perfumes, dust, grass, weeds, wools, and other types of scratchy clothing. °2. You may bathe daily.  Use no soap or the minimal amount necessary to get clean.  Always use moisturizer immediately after bathing (within 3 minutes is best).  Avoid very hot or very cold water.  Avoid bubble baths.  When drying with a towel, pat dry and do not rub. Use a mild, unscented soap (Dove, CeraVe Cleanser, Lever 2000, or Cetaphil). °3. Try to keep the temperature and humidity in the home fairly constant.  Use a bedroom air conditioner in the summer and a humidifier in the winter.  It is very important that  the humidifier be cleaned frequently and thoroughly since mold may grow and cause allergies. °4. Try to avoid scratching.  Atopic dermatitis is often called “the itch that rashes” and it is known that scratching plays a significant role in making atopic dermatitis worse.  Keeping the nails short and well-filed is helpful. °5. Use a fragrance-free, sensitive skin laundry detergent (eg. All Free & Clear).  Run clothes through a second rinse cycle to remove any residual detergents and chemicals.  Bed linens and towels should be washed in hot water to kill dust mites, which are common allergen in atopic patients. °6. In the bedroom, minimize rugs and curtains or other loose fabrics that collect dust. ° °The National Eczema Association (www.eczema-assn.org) is a wonderful organization that sends out a quarterly newsletter with useful information on these types of conditions. Please consider contacting them at the above website or by address: [employer] for [employer] and [employer], [address], [address], [place], [address]  ° ° °

## 2019-10-21 ENCOUNTER — Encounter: Payer: Self-pay | Admitting: Dermatology

## 2019-12-27 ENCOUNTER — Ambulatory Visit: Payer: Medicare Other | Admitting: Dermatology

## 2020-03-21 ENCOUNTER — Encounter: Payer: Self-pay | Admitting: Dermatology

## 2020-03-21 ENCOUNTER — Ambulatory Visit (INDEPENDENT_AMBULATORY_CARE_PROVIDER_SITE_OTHER): Payer: Medicare Other | Admitting: Dermatology

## 2020-03-21 ENCOUNTER — Other Ambulatory Visit: Payer: Self-pay

## 2020-03-21 DIAGNOSIS — D485 Neoplasm of uncertain behavior of skin: Secondary | ICD-10-CM | POA: Diagnosis not present

## 2020-03-21 DIAGNOSIS — D492 Neoplasm of unspecified behavior of bone, soft tissue, and skin: Secondary | ICD-10-CM

## 2020-03-21 DIAGNOSIS — L2081 Atopic neurodermatitis: Secondary | ICD-10-CM | POA: Diagnosis not present

## 2020-03-21 NOTE — Patient Instructions (Signed)

## 2020-03-21 NOTE — Progress Notes (Signed)
   Follow-Up Visit   Subjective  Alyssa Summers is a 69 y.o. female who presents for the following: check spots (arms, legs, itchy, 55m, using cerave cr, hx of AD we prescribed mometasone cr, pt not using).  The following portions of the chart were reviewed this encounter and updated as appropriate:  Tobacco  Allergies  Meds  Problems  Med Hx  Surg Hx  Fam Hx     Review of Systems:  No other skin or systemic complaints except as noted in HPI or Assessment and Plan.  Objective  Well appearing patient in no apparent distress; mood and affect are within normal limits.  A focused examination was performed including arms, legs. Relevant physical exam findings are noted in the Assessment and Plan.  Objective  R calf: Pink scaly patches  Objective  L lat ankle: Crusted patches L lat ankle               Objective  legs, arms: Pink scaly patches and crusted patches legs   Assessment & Plan  Neoplasm of skin (2) R calf  Skin / nail biopsy Type of biopsy: punch   Informed consent: discussed and consent obtained   Timeout: patient name, date of birth, surgical site, and procedure verified   Procedure prep:  Patient was prepped and draped in usual sterile fashion (The patient was cleaned and prepped) Prep type:  Isopropyl alcohol Anesthesia: the lesion was anesthetized in a standard fashion   Anesthetic:  1% lidocaine w/ epinephrine 1-100,000 buffered w/ 8.4% NaHCO3 Punch size:  3 mm Suture size:  4-0 Suture type: nylon   Hemostasis achieved with: suture, pressure and aluminum chloride   Outcome: patient tolerated procedure well   Post-procedure details: sterile dressing applied and wound care instructions given   Dressing type: bandage, pressure dressing and petrolatum    Specimen 1 - Surgical pathology Differential Diagnosis: d48.5 Eczema vs Tinea vs other Check Margins: No Pink scaly patches  L lat ankle  Skin / nail biopsy Type of biopsy: tangential     Informed consent: discussed and consent obtained   Timeout: patient name, date of birth, surgical site, and procedure verified   Procedure prep:  Patient was prepped and draped in usual sterile fashion Prep type:  Isopropyl alcohol Anesthesia: the lesion was anesthetized in a standard fashion   Anesthetic:  1% lidocaine w/ epinephrine 1-100,000 buffered w/ 8.4% NaHCO3 Instrument used: flexible razor blade   Outcome: patient tolerated procedure well   Post-procedure details: sterile dressing applied and wound care instructions given   Dressing type: bandage and bacitracin    Specimen 2 - Surgical pathology Differential Diagnosis: D48.5 Eczema vs Tinea vs other Check Margins: No Crusted patches  Atopic neurodermatitis legs, 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 Start Opzelura cr qd/bid aa itchy rash, avoid face, groin, axilla, sample x 1 Lot SHDS-A 05/2021  Return in about 1 week (around 03/28/2020) for suture removal, discuss bx results.   I, Othelia Pulling, RMA, am acting as scribe for Sarina Ser, MD .  Documentation: I have reviewed the above documentation for accuracy and completeness, and I agree with the above.  Sarina Ser, MD

## 2020-03-28 ENCOUNTER — Other Ambulatory Visit: Payer: Self-pay

## 2020-03-28 ENCOUNTER — Ambulatory Visit (INDEPENDENT_AMBULATORY_CARE_PROVIDER_SITE_OTHER): Payer: Medicare Other | Admitting: Dermatology

## 2020-03-28 DIAGNOSIS — L817 Pigmented purpuric dermatosis: Secondary | ICD-10-CM

## 2020-03-28 MED ORDER — MOMETASONE FUROATE 0.1 % EX OINT: TOPICAL_OINTMENT | CUTANEOUS | 3 refills | Status: DC

## 2020-03-28 NOTE — Progress Notes (Signed)
   Follow-Up Visit   Subjective  Alyssa Summers is a 69 y.o. female who presents for the following: suture removal (patient is here today for suture removal and to discuss pathology results).  The following portions of the chart were reviewed this encounter and updated as appropriate:  Tobacco  Allergies  Meds  Problems  Med Hx  Surg Hx  Fam Hx     Review of Systems:  No other skin or systemic complaints except as noted in HPI or Assessment and Plan.  Objective  Well appearing patient in no apparent distress; mood and affect are within normal limits.  A focused examination was performed including B/L leg. Relevant physical exam findings are noted in the Assessment and Plan.   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   Start Mometasone 0.1% ointment BID x 2 weeks. Then decrease to 5d/wk QD-BID PRN. Avoid f/g/a.  Topical steroids (such as triamcinolone, fluocinolone, fluocinonide, mometasone, clobetasol, halobetasol, betamethasone, hydrocortisone) can cause thinning and lightening of the skin if they are used for too long in the same area. Your physician has selected the right strength medicine for your problem and area affected on the body. Please use your medication only as directed by your physician to prevent side effects.  Ok to continue Cetaphil moisturizer daily.   mometasone (ELOCON) 0.1 % ointment - B/L leg  Return in about 3 months (around 06/28/2020) for rash follow up .  Luther Redo, CMA, am acting as scribe for Sarina Ser, MD .  Documentation: I have reviewed the above documentation for accuracy and completeness, and I agree with the above.  Sarina Ser, MD

## 2020-03-29 ENCOUNTER — Encounter: Payer: Self-pay | Admitting: Dermatology

## 2020-07-25 ENCOUNTER — Ambulatory Visit (INDEPENDENT_AMBULATORY_CARE_PROVIDER_SITE_OTHER): Payer: Medicare Other | Admitting: Dermatology

## 2020-07-25 ENCOUNTER — Other Ambulatory Visit: Payer: Self-pay

## 2020-07-25 ENCOUNTER — Encounter: Payer: Self-pay | Admitting: Dermatology

## 2020-07-25 DIAGNOSIS — L817 Pigmented purpuric dermatosis: Secondary | ICD-10-CM

## 2020-07-25 NOTE — Progress Notes (Signed)
   Follow-Up Visit   Subjective  Alyssa Summers is a 70 y.o. female who presents for the following: Follow-up (Pigmented purpuric dermatosis follow up - Rash is better but she does have some red marks on her legs. She stopped the Mometasone when her rash improved).  She now has some recurrence after stopping mometasone.  The following portions of the chart were reviewed this encounter and updated as appropriate:   Tobacco  Allergies  Meds  Problems  Med Hx  Surg Hx  Fam Hx     Review of Systems:  No other skin or systemic complaints except as noted in HPI or Assessment and Plan.  Objective  Well appearing patient in no apparent distress; mood and affect are within normal limits.  A focused examination was performed including legs. Relevant physical exam findings are noted in the Assessment and Plan.  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 Lower legs Biopsy proven - Chronic and persistent  Restart Mometasone cream qd 5 times per week to any active areas Start Cerave cream daily  Other Related Medications mometasone (ELOCON) 0.1 % ointment  Return in about 6 weeks (around 09/05/2020) for Follow up. I, Ashok Cordia, CMA, am acting as scribe for Sarina Ser, MD .  Documentation: I have reviewed the above documentation for accuracy and completeness, and I agree with the above.  Sarina Ser, MD

## 2020-08-30 ENCOUNTER — Emergency Department
Admission: EM | Admit: 2020-08-30 | Discharge: 2020-08-30 | Disposition: A | Discharge status: Home / Self Care | Payer: 59 | Attending: Physician Assistant | Admitting: Physician Assistant

## 2020-08-30 ENCOUNTER — Other Ambulatory Visit: Payer: Self-pay

## 2020-08-30 DIAGNOSIS — Z79899 Other long term (current) drug therapy: Secondary | ICD-10-CM | POA: Diagnosis not present

## 2020-08-30 DIAGNOSIS — Z7984 Long term (current) use of oral hypoglycemic drugs: Secondary | ICD-10-CM | POA: Insufficient documentation

## 2020-08-30 DIAGNOSIS — H5712 Ocular pain, left eye: Secondary | ICD-10-CM | POA: Diagnosis present

## 2020-08-30 DIAGNOSIS — I10 Essential (primary) hypertension: Secondary | ICD-10-CM | POA: Insufficient documentation

## 2020-08-30 DIAGNOSIS — E119 Type 2 diabetes mellitus without complications: Secondary | ICD-10-CM | POA: Diagnosis not present

## 2020-08-30 DIAGNOSIS — Z7982 Long term (current) use of aspirin: Secondary | ICD-10-CM | POA: Diagnosis not present

## 2020-08-30 DIAGNOSIS — F17210 Nicotine dependence, cigarettes, uncomplicated: Secondary | ICD-10-CM | POA: Diagnosis not present

## 2020-08-30 DIAGNOSIS — H1013 Acute atopic conjunctivitis, bilateral: Secondary | ICD-10-CM | POA: Diagnosis not present

## 2020-08-30 MED ORDER — NAPHAZOLINE-PHENIRAMINE 0.025-0.3 % OP SOLN: 1.0000 [drp] | Freq: Four times a day (QID) | OPHTHALMIC | 1 refills | Status: AC | PRN

## 2020-08-30 NOTE — Discharge Instructions (Addendum)
Follow discharge care instructions and use eyedrops as directed.

## 2020-08-30 NOTE — ED Triage Notes (Signed)
Pt to ER with complaints of eye pain and head fullness.   States her eye issues started in February but have been worse this week with no relief from over the counter eye drops. Eyes are draining, swollen, and itchy. Reports ongoing issues with allergies.

## 2020-08-30 NOTE — ED Provider Notes (Signed)
[facility] Emergency Department Provider Note   ____________________________________________   Event Date/Time   First MD Initiated Contact with Patient 08/30/20 1142     (approximate)  I have reviewed the triage vital signs and the nursing notes.   HISTORY  Chief Complaint Eye Pain    HPI Alyssa Summers is a 70 y.o. female patient complain of 3 months of itchy eyes,, intermitting drainage, and edema.  Patient state receiving no relief from over-the-counter eyedrops.  Patient initially OTC drops were helping.  Patient denies vision disturbance.  Denies pain.         Past Medical History:  Diagnosis Date  . Diabetes mellitus without complication ([place])   . Hypertension   . Tachycardia     Patient Active Problem List   Diagnosis Date Noted  . TIA (transient ischemic attack) 01/28/2019    Past Surgical History:  Procedure Laterality Date  . lap choleycystecomy      Prior to Admission medications   Medication Sig Start Date End Date Taking? Authorizing Provider  naphazoline-pheniramine (NAPHCON-A) 0.025-0.3 % ophthalmic solution Place 1 drop into both eyes 4 (four) times daily as needed for eye irritation. 08/30/20  Yes Sable Feil, PA-C  amLODipine (NORVASC) 5 MG tablet Take 5 mg by mouth daily. 11/19/18   [provider]  aspirin EC 81 MG tablet Take 81 mg by mouth daily. 09/23/18   [provider]  gabapentin (NEURONTIN) 300 MG capsule Take 300-600 mg by mouth See admin instructions. Take 1 capsule (300mg ) by mouth every morning and daily at lunchtime then take 2 capsules (600mg ) by mouth every night at bedtime 12/01/18   [provider]  HYDROcodone-acetaminophen (NORCO) 5-325 MG tablet Take 1 tablet by mouth every 4 (four) hours as needed for moderate pain. 04/12/19   Merlyn Lot, MD  JARDIANCE 25 MG TABS tablet Take 25 mg by mouth daily. 10/25/18   [provider]  lovastatin (MEVACOR) 40 MG tablet  Take 40 mg by mouth daily after supper. 09/23/18   [provider]  metFORMIN (GLUCOPHAGE-XR) 500 MG 24 hr tablet Take 1,000 mg by mouth 2 (two) times daily. 11/16/18   [provider]  metoprolol succinate (TOPROL-XL) 50 MG 24 hr tablet Take 50 mg by mouth daily. 09/24/18   [provider]  mometasone (ELOCON) 0.1 % ointment Apply to aa's BID x 2 wks. Then decrease use to QD-BID 5d/wk PRN. 03/28/20   Ralene Bathe, MD  montelukast (SINGULAIR) 10 MG tablet Take 10 mg by mouth daily. 10/22/18   [provider]  vitamin B-12 (CYANOCOBALAMIN) 1000 MCG tablet Take 1 tablet (1,000 mcg total) by mouth daily. 01/29/19   Vaughan Basta, MD    Allergies Glipizide and Soy allergy  Family History  Problem Relation Age of Onset  . Hypertension Mother     Social History Social History   Tobacco Use  . Smoking status: Current Every Day Smoker    Packs/day: 0.50    Types: Cigarettes  . Smokeless tobacco: Never Used  Vaping Use  . Vaping Use: Never used  Substance Use Topics  . Alcohol use: Yes  . Drug use: Never    Review of Systems Constitutional: No fever/chills Eyes: No visual changes.  Irritated eyes. ENT: No sore throat. Cardiovascular: Denies chest pain. Respiratory: Denies shortness of breath. Gastrointestinal: No abdominal pain.  No nausea, no vomiting.  No diarrhea.  No constipation. Genitourinary: Negative for dysuria. Musculoskeletal: Negative for back pain. Skin: Negative  for rash. Neurological: Negative for headaches, focal weakness or numbness. Endocrine:  Diabetes and hypertension. Allergic/Immunilogical: Glipizide and soy. ____________________________________________   PHYSICAL EXAM:  VITAL SIGNS: ED Triage Vitals  Enc Vitals Group     BP 08/30/20 1143 122/87     Pulse Rate 08/30/20 1143 100     Resp 08/30/20 1143 16     Temp 08/30/20 1143 98.3 F (36.8 C)     Temp Source 08/30/20 1143 Oral     SpO2 08/30/20 1143 99 %      Weight 08/30/20 1140 166 lb (75.3 kg)     Height 08/30/20 1140 5\' 8"  (1.727 m)     Head Circumference --      Peak Flow --      Pain Score 08/30/20 1140 0     Pain Loc --      Pain Edu? --      Excl. in [place]? --     Constitutional: Alert and oriented. Well appearing and in no acute distress. Eyes: Bilateral erythematous conjunctiva PERRL. EOMI. Head: Atraumatic. Nose: No congestion/rhinnorhea. Mouth/Throat: Mucous membranes are moist.  Oropharynx non-erythematous. Cardiovascular: Normal rate, regular rhythm. Grossly normal heart sounds.  Good peripheral circulation. Respiratory: Normal respiratory effort.  No retractions. Lungs CTAB. Genitourinary: Deferred Skin:  Skin is warm, dry and intact. No rash noted. Psychiatric: Mood and affect are normal. Speech and behavior are normal.  ____________________________________________   LABS (all labs ordered are listed, but only abnormal results are displayed)  Labs Reviewed - No data to display ____________________________________________  EKG   ____________________________________________  RADIOLOGY I, Sable Feil, personally viewed and evaluated these images (plain radiographs) as part of my medical decision making, as well as reviewing the written report by the radiologist.  ED MD interpretation:    Official radiology report(s): No results found.  ____________________________________________   PROCEDURES  Procedure(s) performed (including Critical Care):  Procedures   ____________________________________________   INITIAL IMPRESSION / ASSESSMENT AND PLAN / ED COURSE  As part of my medical decision making, I reviewed the following data within the [place]         Patient presents with 3 months of intermittent irritation secondary to ongoing allergies with pollen.  Patient no relief over-the-counter eyedrops.  Patient complaint physical exam consistent with likely conjunctivitis.   Patient given a prescription for Naphcon eyedrops and advised to follow-up PCP if no improvement in 3 to 4 days.     ____________________________________________   FINAL CLINICAL IMPRESSION(S) / ED DIAGNOSES  Final diagnoses:  Allergic conjunctivitis of both eyes     ED Discharge Orders         Ordered    naphazoline-pheniramine (NAPHCON-A) 0.025-0.3 % ophthalmic solution  4 times daily PRN        08/30/20 1200          *Please note:  Manda Holstad was evaluated in Emergency Department on 08/30/2020 for the symptoms described in the history of present illness. She was evaluated in the context of the global COVID-19 pandemic, which necessitated consideration that the patient might be at risk for infection with the SARS-CoV-2 virus that causes COVID-19. Institutional protocols and algorithms that pertain to the evaluation of patients at risk for COVID-19 are in a state of rapid change based on information released by regulatory bodies including the CDC and federal and state organizations. These policies and algorithms were followed during the patient's care in the ED.  Some ED evaluations and interventions may be delayed  as a result of limited staffing during and the pandemic.*   Note:  This document was prepared using Dragon voice recognition software and may include unintentional dictation errors.    Sable Feil, PA-C 08/30/20 1206    Merlyn Lot, MD 08/30/20 1344

## 2020-08-30 NOTE — ED Notes (Signed)
See triage note  Presents with eye problems  States sx's started in feb  Both eyes irritated and swollen with some drainage

## 2020-09-05 ENCOUNTER — Ambulatory Visit (INDEPENDENT_AMBULATORY_CARE_PROVIDER_SITE_OTHER): Payer: 59 | Admitting: Dermatology

## 2020-09-05 ENCOUNTER — Other Ambulatory Visit: Payer: Self-pay

## 2020-09-05 DIAGNOSIS — L817 Pigmented purpuric dermatosis: Secondary | ICD-10-CM

## 2020-09-05 MED ORDER — MOMETASONE FUROATE 0.1 % EX OINT: TOPICAL_OINTMENT | CUTANEOUS | 3 refills | Status: DC

## 2020-09-05 NOTE — Patient Instructions (Addendum)
If you have any questions or concerns for your doctor, please call our main line at [phone] and press option 4 to reach your doctor's medical assistant. If no one answers, please leave a voicemail as directed and we will return your call as soon as possible. Messages left after 4 pm will be answered the following business day.   You may also send [place] a message via [place]. We typically respond to MyChart messages within 1-2 business days.  For prescription refills, please ask your pharmacy to contact our office. Our fax number is [phone].  If you have an urgent issue when the clinic is closed that cannot wait until the next business day, you can page your doctor at the number below.    Please note that while we do our best to be available for urgent issues outside of office hours, we are not available 24/7.   If you have an urgent issue and are unable to reach [place], you may choose to seek medical care at your doctor's office, retail clinic, urgent care center, or emergency room.  If you have a medical emergency, please immediately call 911 or go to the emergency department.  Pager Numbers  - Dr. Nehemiah Massed: [phone]  - Dr. Laurence Ferrari: [phone]  - Dr. Nicole Kindred: [phone]  In the event of inclement weather, please call our main line at [phone] for an update on the status of any delays or closures.  Dermatology Medication Tips: Please keep the boxes that topical medications come in in order to help keep track of the instructions about where and how to use these. Pharmacies typically print the medication instructions only on the boxes and not directly on the medication tubes.   If your medication is too expensive, please contact our office at [phone] option 4 or send [place] a message through [place].   We are unable to tell what your co-pay for medications will be in advance as this is different depending on your insurance coverage. However, we may be able to find a substitute  medication at lower cost or fill out paperwork to get insurance to cover a needed medication.   If a prior authorization is required to get your medication covered by your insurance company, please allow [place] 1-2 business days to complete this process.  Drug prices often vary depending on where the prescription is filled and some pharmacies may offer cheaper prices.  The website www.goodrx.com contains coupons for medications through different pharmacies. The prices here do not account for what the cost may be with help from insurance (it may be cheaper with your insurance), but the website can give you the price if you did not use any insurance.  - You can print the associated coupon and take it with your prescription to the pharmacy.  - You may also stop by our office during regular business hours and pick up a GoodRx coupon card.  - If you need your prescription sent electronically to a different pharmacy, notify our office through [facility] or by phone at [phone] option 4.    Restart Mometasone crean daily up to 4- 5 days a week to pink scaly areas until clear, then as needed for flares Increase Cerave cream to daily to whole leg

## 2020-09-05 NOTE — Progress Notes (Signed)
   Follow-Up Visit   Subjective  Alyssa Summers is a 70 y.o. female who presents for the following: Pigmented purpuric dermatosis f/u (Bil lower legs, improved per patient, cerave cream prn, mometasone cr when flared).  The following portions of the chart were reviewed this encounter and updated as appropriate:   Tobacco  Allergies  Meds  Problems  Med Hx  Surg Hx  Fam Hx     Review of Systems:  No other skin or systemic complaints except as noted in HPI or Assessment and Plan.  Objective  Well appearing patient in no apparent distress; mood and affect are within normal limits.  A focused examination was performed including bil legs. Relevant physical exam findings are noted in the Assessment and Plan.  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  I, Othelia Pulling, RMA, am acting as scribe for Sarina Ser, MD .  Documentation: I have reviewed the above documentation for accuracy and completeness, and I agree with the above.  Sarina Ser, MD

## 2020-09-07 ENCOUNTER — Encounter: Payer: Self-pay | Admitting: Dermatology

## 2021-11-28 ENCOUNTER — Other Ambulatory Visit: Payer: Self-pay | Admitting: Infectious Diseases

## 2021-11-28 ENCOUNTER — Ambulatory Visit
Admission: RE | Admit: 2021-11-28 | Discharge: 2021-11-28 | Disposition: A | Discharge status: Home / Self Care | Payer: 59 | Source: Ambulatory Visit | Attending: Infectious Diseases | Admitting: Infectious Diseases

## 2021-11-28 DIAGNOSIS — M7989 Other specified soft tissue disorders: Secondary | ICD-10-CM

## 2021-11-28 DIAGNOSIS — M79631 Pain in right forearm: Secondary | ICD-10-CM | POA: Insufficient documentation

## 2021-11-28 DIAGNOSIS — M6282 Rhabdomyolysis: Secondary | ICD-10-CM | POA: Diagnosis not present

## 2021-11-28 DIAGNOSIS — A419 Sepsis, unspecified organism: Secondary | ICD-10-CM | POA: Diagnosis not present

## 2021-11-29 ENCOUNTER — Observation Stay: Payer: 59

## 2021-11-29 ENCOUNTER — Encounter: Payer: Self-pay | Admitting: Emergency Medicine

## 2021-11-29 ENCOUNTER — Inpatient Hospital Stay
Admission: EM | Admit: 2021-11-29 | Discharge: 2021-12-02 | DRG: 872 | Disposition: A | Discharge status: Home / Self Care | Payer: 59 | Attending: Internal Medicine | Admitting: Internal Medicine

## 2021-11-29 ENCOUNTER — Other Ambulatory Visit: Payer: Self-pay

## 2021-11-29 ENCOUNTER — Emergency Department: Payer: 59

## 2021-11-29 DIAGNOSIS — C50911 Malignant neoplasm of unspecified site of right female breast: Secondary | ICD-10-CM | POA: Diagnosis present

## 2021-11-29 DIAGNOSIS — Z888 Allergy status to other drugs, medicaments and biological substances status: Secondary | ICD-10-CM

## 2021-11-29 DIAGNOSIS — L03113 Cellulitis of right upper limb: Secondary | ICD-10-CM | POA: Diagnosis present

## 2021-11-29 DIAGNOSIS — R6510 Systemic inflammatory response syndrome (SIRS) of non-infectious origin without acute organ dysfunction: Secondary | ICD-10-CM

## 2021-11-29 DIAGNOSIS — I10 Essential (primary) hypertension: Secondary | ICD-10-CM | POA: Diagnosis not present

## 2021-11-29 DIAGNOSIS — G622 Polyneuropathy due to other toxic agents: Secondary | ICD-10-CM | POA: Diagnosis present

## 2021-11-29 DIAGNOSIS — E119 Type 2 diabetes mellitus without complications: Secondary | ICD-10-CM | POA: Diagnosis not present

## 2021-11-29 DIAGNOSIS — T451X5A Adverse effect of antineoplastic and immunosuppressive drugs, initial encounter: Secondary | ICD-10-CM | POA: Diagnosis present

## 2021-11-29 DIAGNOSIS — R000 Tachycardia, unspecified: Secondary | ICD-10-CM

## 2021-11-29 DIAGNOSIS — E1169 Type 2 diabetes mellitus with other specified complication: Secondary | ICD-10-CM

## 2021-11-29 DIAGNOSIS — A419 Sepsis, unspecified organism: Principal | ICD-10-CM | POA: Diagnosis present

## 2021-11-29 DIAGNOSIS — Z91018 Allergy to other foods: Secondary | ICD-10-CM

## 2021-11-29 DIAGNOSIS — M6282 Rhabdomyolysis: Secondary | ICD-10-CM | POA: Diagnosis not present

## 2021-11-29 DIAGNOSIS — F17210 Nicotine dependence, cigarettes, uncomplicated: Secondary | ICD-10-CM | POA: Diagnosis present

## 2021-11-29 DIAGNOSIS — C50919 Malignant neoplasm of unspecified site of unspecified female breast: Secondary | ICD-10-CM | POA: Diagnosis present

## 2021-11-29 DIAGNOSIS — Z7984 Long term (current) use of oral hypoglycemic drugs: Secondary | ICD-10-CM

## 2021-11-29 DIAGNOSIS — Z720 Tobacco use: Secondary | ICD-10-CM | POA: Diagnosis present

## 2021-11-29 DIAGNOSIS — Z79899 Other long term (current) drug therapy: Secondary | ICD-10-CM

## 2021-11-29 DIAGNOSIS — G459 Transient cerebral ischemic attack, unspecified: Secondary | ICD-10-CM

## 2021-11-29 DIAGNOSIS — E785 Hyperlipidemia, unspecified: Secondary | ICD-10-CM | POA: Diagnosis present

## 2021-11-29 DIAGNOSIS — D849 Immunodeficiency, unspecified: Secondary | ICD-10-CM | POA: Diagnosis present

## 2021-11-29 DIAGNOSIS — Z7982 Long term (current) use of aspirin: Secondary | ICD-10-CM

## 2021-11-29 DIAGNOSIS — Z8673 Personal history of transient ischemic attack (TIA), and cerebral infarction without residual deficits: Secondary | ICD-10-CM

## 2021-11-29 DIAGNOSIS — Z8249 Family history of ischemic heart disease and other diseases of the circulatory system: Secondary | ICD-10-CM

## 2021-11-29 DIAGNOSIS — M609 Myositis, unspecified: Secondary | ICD-10-CM | POA: Diagnosis present

## 2021-11-29 HISTORY — DX: Malignant neoplasm of unspecified site of unspecified female breast: C50.919

## 2021-11-29 LAB — CBC WITH DIFFERENTIAL/PLATELET
Abs Immature Granulocytes: 0.12 10*3/uL — ABNORMAL HIGH (ref 0.00–0.07)
Basophils Absolute: 0.2 10*3/uL — ABNORMAL HIGH (ref 0.0–0.1)
Basophils Relative: 1 %
Eosinophils Absolute: 0.1 10*3/uL (ref 0.0–0.5)
Eosinophils Relative: 0 %
HCT: 39.5 % (ref 36.0–46.0)
Hemoglobin: 13.1 g/dL (ref 12.0–15.0)
Immature Granulocytes: 1 %
Lymphocytes Relative: 6 %
Lymphs Abs: 1.2 10*3/uL (ref 0.7–4.0)
MCH: 29.8 pg (ref 26.0–34.0)
MCHC: 33.2 g/dL (ref 30.0–36.0)
MCV: 90 fL (ref 80.0–100.0)
Monocytes Absolute: 1.3 10*3/uL — ABNORMAL HIGH (ref 0.1–1.0)
Monocytes Relative: 6 %
Neutro Abs: 17.6 10*3/uL — ABNORMAL HIGH (ref 1.7–7.7)
Neutrophils Relative %: 86 %
Platelets: 225 10*3/uL (ref 150–400)
RBC: 4.39 MIL/uL (ref 3.87–5.11)
RDW: 16 % — ABNORMAL HIGH (ref 11.5–15.5)
WBC: 20.5 10*3/uL — ABNORMAL HIGH (ref 4.0–10.5)
nRBC: 0 % (ref 0.0–0.2)

## 2021-11-29 LAB — COMPREHENSIVE METABOLIC PANEL
ALT: 17 U/L (ref 0–44)
AST: 23 U/L (ref 15–41)
Albumin: 3.7 g/dL (ref 3.5–5.0)
Alkaline Phosphatase: 77 U/L (ref 38–126)
Anion gap: 7 (ref 5–15)
BUN: 12 mg/dL (ref 8–23)
CO2: 21 mmol/L — ABNORMAL LOW (ref 22–32)
Calcium: 9.3 mg/dL (ref 8.9–10.3)
Chloride: 108 mmol/L (ref 98–111)
Creatinine, Ser: 0.63 mg/dL (ref 0.44–1.00)
GFR, Estimated: >60 mL/min (ref >60)
Glucose, Bld: 246 mg/dL — ABNORMAL HIGH (ref 70–99)
Potassium: 4.2 mmol/L (ref 3.5–5.1)
Sodium: 136 mmol/L (ref 135–145)
Total Bilirubin: 0.9 mg/dL (ref 0.3–1.2)
Total Protein: 6.4 g/dL — ABNORMAL LOW (ref 6.5–8.1)

## 2021-11-29 LAB — HIV ANTIBODY (ROUTINE TESTING W REFLEX): HIV Screen 4th Generation wRfx: NONREACTIVE

## 2021-11-29 LAB — PROCALCITONIN: Procalcitonin: <0.1 ng/mL

## 2021-11-29 LAB — URINALYSIS, ROUTINE W REFLEX MICROSCOPIC
Bilirubin Urine: NEGATIVE
Glucose, UA: >=500 mg/dL — AB
Hgb urine dipstick: NEGATIVE
Ketones, ur: 80 mg/dL — AB
Leukocytes,Ua: NEGATIVE
Nitrite: NEGATIVE
Protein, ur: NEGATIVE mg/dL
Specific Gravity, Urine: 1.03 (ref 1.005–1.030)
pH: 5 (ref 5.0–8.0)

## 2021-11-29 LAB — CBG MONITORING, ED: Glucose-Capillary: 258 mg/dL — ABNORMAL HIGH (ref 70–99)

## 2021-11-29 LAB — TROPONIN I (HIGH SENSITIVITY): Troponin I (High Sensitivity): 6 ng/L (ref <18)

## 2021-11-29 LAB — CK: Total CK: 552 U/L — ABNORMAL HIGH (ref 38–234)

## 2021-11-29 LAB — MAGNESIUM: Magnesium: 2 mg/dL (ref 1.7–2.4)

## 2021-11-29 LAB — GLUCOSE, CAPILLARY: Glucose-Capillary: 325 mg/dL — ABNORMAL HIGH (ref 70–99)

## 2021-11-29 LAB — LACTIC ACID, PLASMA: Lactic Acid, Venous: 1.9 mmol/L (ref 0.5–1.9)

## 2021-11-29 MED ORDER — ONDANSETRON HCL 4 MG/2ML IJ SOLN
4.0000 mg | Freq: Once | INTRAMUSCULAR | Status: AC
  Administered 2021-11-29: 4 mg via INTRAVENOUS
  Filled 2021-11-29: qty 2

## 2021-11-29 MED ORDER — ENOXAPARIN SODIUM 40 MG/0.4ML IJ SOSY
40.0000 mg | PREFILLED_SYRINGE | INTRAMUSCULAR | Status: DC
Start: 2021-11-29 — End: 2021-12-02
  Administered 2021-11-30 – 2021-12-01 (×2): 40 mg via SUBCUTANEOUS
  Filled 2021-11-29 (×3): qty 0.4

## 2021-11-29 MED ORDER — NICOTINE 21 MG/24HR TD PT24
21.0000 mg | MEDICATED_PATCH | Freq: Every day | TRANSDERMAL | Status: DC
  Filled 2021-11-29: qty 1

## 2021-11-29 MED ORDER — ONDANSETRON HCL 4 MG/2ML IJ SOLN: 4.0000 mg | Freq: Three times a day (TID) | INTRAMUSCULAR | Status: DC | PRN

## 2021-11-29 MED ORDER — INSULIN ASPART 100 UNIT/ML IJ SOLN: 0.0000 [IU] | Freq: Three times a day (TID) | INTRAMUSCULAR | Status: DC

## 2021-11-29 MED ORDER — SODIUM CHLORIDE 0.9 % IV BOLUS
1000.0000 mL | Freq: Once | INTRAVENOUS | Status: AC
Start: 2021-11-29 — End: 2021-11-29
  Administered 2021-11-29: 1000 mL via INTRAVENOUS

## 2021-11-29 MED ORDER — MORPHINE SULFATE (PF) 4 MG/ML IV SOLN
4.0000 mg | Freq: Once | INTRAVENOUS | Status: AC
  Administered 2021-11-29: 4 mg via INTRAVENOUS
  Filled 2021-11-29: qty 1

## 2021-11-29 MED ORDER — ACETAMINOPHEN 325 MG PO TABS: 650.0000 mg | ORAL_TABLET | Freq: Four times a day (QID) | ORAL | Status: DC | PRN

## 2021-11-29 MED ORDER — METFORMIN HCL ER 500 MG PO TB24
500.0000 mg | ORAL_TABLET | Freq: Two times a day (BID) | ORAL | Status: DC
  Administered 2021-11-29: 500 mg via ORAL
  Filled 2021-11-29 (×3): qty 1

## 2021-11-29 MED ORDER — AMLODIPINE BESYLATE 5 MG PO TABS
5.0000 mg | ORAL_TABLET | Freq: Every day | ORAL | Status: DC
  Administered 2021-11-29 – 2021-12-02 (×4): 5 mg via ORAL
  Filled 2021-11-29 (×4): qty 1

## 2021-11-29 MED ORDER — OXYCODONE-ACETAMINOPHEN 5-325 MG PO TABS
1.0000 | ORAL_TABLET | ORAL | Status: DC | PRN
  Administered 2021-11-30 – 2021-12-02 (×5): 1 via ORAL
  Filled 2021-11-29 (×6): qty 1

## 2021-11-29 MED ORDER — GADOBUTROL 1 MMOL/ML IV SOLN
7.0000 mL | Freq: Once | INTRAVENOUS | Status: AC | PRN
  Administered 2021-11-29: 7 mL via INTRAVENOUS

## 2021-11-29 MED ORDER — LINAGLIPTIN 5 MG PO TABS
5.0000 mg | ORAL_TABLET | Freq: Every day | ORAL | Status: DC
  Administered 2021-11-29: 5 mg via ORAL
  Filled 2021-11-29 (×3): qty 1

## 2021-11-29 MED ORDER — MORPHINE SULFATE (PF) 2 MG/ML IV SOLN
1.0000 mg | INTRAVENOUS | Status: DC | PRN
  Administered 2021-11-29 – 2021-12-02 (×7): 1 mg via INTRAVENOUS
  Filled 2021-11-29 (×7): qty 1

## 2021-11-29 MED ORDER — HYDRALAZINE HCL 20 MG/ML IJ SOLN: 5.0000 mg | INTRAMUSCULAR | Status: DC | PRN

## 2021-11-29 MED ORDER — INSULIN ASPART 100 UNIT/ML IJ SOLN: 0.0000 [IU] | Freq: Every day | INTRAMUSCULAR | Status: DC

## 2021-11-29 MED ORDER — INSULIN ASPART 100 UNIT/ML IJ SOLN
0.0000 [IU] | Freq: Three times a day (TID) | INTRAMUSCULAR | Status: DC
  Administered 2021-11-30: 2 [IU] via SUBCUTANEOUS
  Administered 2021-11-30 – 2021-12-01 (×4): 3 [IU] via SUBCUTANEOUS
  Administered 2021-12-01: 2 [IU] via SUBCUTANEOUS
  Administered 2021-12-01: 8 [IU] via SUBCUTANEOUS
  Administered 2021-12-01: 2 [IU] via SUBCUTANEOUS
  Administered 2021-12-02: 3 [IU] via SUBCUTANEOUS
  Administered 2021-12-02: 2 [IU] via SUBCUTANEOUS
  Filled 2021-11-29 (×10): qty 1

## 2021-11-29 MED ORDER — SODIUM CHLORIDE 0.9 % IV SOLN: INTRAVENOUS | Status: DC

## 2021-11-29 MED ORDER — ALBUTEROL SULFATE (2.5 MG/3ML) 0.083% IN NEBU: 3.0000 mL | INHALATION_SOLUTION | RESPIRATORY_TRACT | Status: DC | PRN

## 2021-11-29 NOTE — ED Provider Notes (Signed)
[facility] Provider Note    Event Date/Time   First MD Initiated Contact with Patient 11/29/21 0720     (approximate)   History   Extremity Pain   HPI  Alyssa Summers is a 71 y.o. female   presents to the ED with complaint of continued right upper extremity pain that was evaluated at [facility].  A ultrasound to rule out DVT was ordered and was reviewed in her chart.  Patient was made aware that her ultrasound was negative for DVT.  She now reports that she has pain and both her arms and in her left leg.  Patient denies any fever, chills, nausea, vomiting, diarrhea or cough.  Patient has history of right breast cancer and is currently being seen at [facility] and has been getting chemotherapy which normally makes her fatigued and achy.  Patient also has a history of diabetes, hypertension and tachycardia.       Physical Exam   Triage Vital Signs: ED Triage Vitals  Enc Vitals Group     BP 11/29/21 0557 (!) 120/96     Pulse Rate 11/29/21 0557 (!) 103     Resp 11/29/21 0557 18     Temp 11/29/21 0557 99 F (37.2 C)     Temp Source 11/29/21 0557 Oral     SpO2 11/29/21 0557 100 %     Weight 11/29/21 0553 160 lb (72.6 kg)     Height 11/29/21 0553 '5\' 8"'$  (1.727 m)     Head Circumference --      Peak Flow --      Pain Score 11/29/21 0552 10     Pain Loc --      Pain Edu? --      Excl. in [place]? --     Most recent vital signs: Vitals:   11/29/21 0954 11/29/21 1244  BP: 128/80 120/78  Pulse: 99 88  Resp: 18 18  Temp:    SpO2: 100% 99%     General: Awake, no distress.  CV:  Good peripheral perfusion.  Resp:  Normal effort.  Lungs are clear bilaterally. Abd:  No distention.  Soft, nontender. Other:  Right upper extremity is tender to palpation and range of motion is mildly restricted secondary to discomfort.  No obvious edema in comparison with the left arm which also now hurts.  Patient complains of left lower extremity feeling the same.   No pitting edema, skin discoloration or skin injury present.  Skin is warm and dry.  Patient initially had a slow guarded gait with assistance of her husband.   ED Results / Procedures / Treatments   Labs (all labs ordered are listed, but only abnormal results are displayed) Labs Reviewed  COMPREHENSIVE METABOLIC PANEL - Abnormal; Notable for the following components:      Result Value   CO2 21 (*)    Glucose, Bld 246 (*)    Total Protein 6.4 (*)    All other components within normal limits  CBC WITH DIFFERENTIAL/PLATELET - Abnormal; Notable for the following components:   WBC 20.5 (*)    RDW 16.0 (*)    Neutro Abs 17.6 (*)    Monocytes Absolute 1.3 (*)    Basophils Absolute 0.2 (*)    Abs Immature Granulocytes 0.12 (*)    All other components within normal limits  URINALYSIS, ROUTINE W REFLEX MICROSCOPIC - Abnormal; Notable for the following components:   Color, Urine YELLOW (*)    APPearance CLEAR (*)  Glucose, UA >=500 (*)    Ketones, ur 80 (*)    Bacteria, UA RARE (*)    All other components within normal limits  CK - Abnormal; Notable for the following components:   Total CK 552 (*)    All other components within normal limits  CULTURE, BLOOD (ROUTINE X 2)  CULTURE, BLOOD (ROUTINE X 2)  MAGNESIUM  LACTIC ACID, PLASMA  PROCALCITONIN  HIV ANTIBODY (ROUTINE TESTING W REFLEX)  TROPONIN I (HIGH SENSITIVITY)         RADIOLOGY    Venous ultrasound of the right upper extremity that was performed yesterday through [facility] was able to be seen in her chart and was negative for DVT.    PROCEDURES:  Critical Care performed:   Procedures   MEDICATIONS ORDERED IN ED: Medications  0.9 %  sodium chloride infusion (has no administration in time range)  nicotine (NICODERM CQ - dosed in mg/24 hours) patch 21 mg (has no administration in time range)  oxyCODONE-acetaminophen (PERCOCET/ROXICET) 5-325 MG per tablet 1 tablet (has no administration in time range)   ondansetron (ZOFRAN) injection 4 mg (has no administration in time range)  acetaminophen (TYLENOL) tablet 650 mg (has no administration in time range)  hydrALAZINE (APRESOLINE) injection 5 mg (has no administration in time range)  albuterol (PROVENTIL) (2.5 MG/3ML) 0.083% nebulizer solution 3 mL (has no administration in time range)  insulin aspart (novoLOG) injection 0-9 Units (has no administration in time range)  insulin aspart (novoLOG) injection 0-5 Units (has no administration in time range)  enoxaparin (LOVENOX) injection 40 mg (has no administration in time range)  amLODipine (NORVASC) tablet 5 mg (has no administration in time range)  morphine (PF) 2 MG/ML injection 1 mg (has no administration in time range)  sodium chloride 0.9 % bolus 1,000 mL (0 mLs Intravenous Stopped 11/29/21 1344)  ondansetron (ZOFRAN) injection 4 mg (4 mg Intravenous Given 11/29/21 0947)  morphine (PF) 4 MG/ML injection 4 mg (4 mg Intravenous Given 11/29/21 0947)     IMPRESSION / MDM / ASSESSMENT AND PLAN / ED COURSE  I reviewed the triage vital signs and the nursing notes.   Differential diagnosis includes, but is not limited to, rhabdomyolysis, myositis, abnormal labs.  71 year old female presents to the ED with initially complaint yesterday of right upper extremity pain but now complains of bilateral upper and lower extremity pain which increases with movement.  Patient states that yesterday her right arm was swollen and concerning so an ultrasound was done to rule out a DVT which is read as negative per radiologist.  Patient was obviously uncomfortable and states that the [place] that was prescribed for her by her PCP was not effective last evening.  Patient was given morphine 4 mg IV while in the ED and a saline bolus.  In looking through her chart she has had multiple CBCs both at [facility] and at [facility] oncology with her WBC being as high as 26.6 on 11/01/2021.  CK noted at [facility] on 7/11 was 329  and then repeated on 7/23 which was 391.  Today in the emergency department her CK is 552 with a WBC of 20.5.  Urinalysis showed rare bacteria and over 500 glucose but patient is a known diabetic.  CMP showed a glucose of 246 but otherwise normal.  Troponin was negative at 6 and magnesium was within normal limits at 2.0.  Is unclear looking through her chart why she would have an elevated CK.  She was recently  told to stop her statin drug to see if this reduced her muscle aches which has had no effect.  Patient's muscle pain has improved to where she is ambulatory with little to no assistance with 1 L of normal saline and a one-time dose of morphine IV.  I spoke with patient about at least a 24-hour observation to also recheck her CK and white count and further evaluation of why this is trending upward.  Patient is not opposed to this and I consulted with hospitalist for admission.  Abnormal CK and WBC with bilateral upper and lower extremity pain with abnormal labs trending upward without a clear explanation.     Patient's presentation is most consistent with acute presentation with potential threat to life or bodily function.  FINAL CLINICAL IMPRESSION(S) / ED DIAGNOSES   Final diagnoses:  Myositis of both upper arms  Myositis of both lower legs     Rx / DC Orders   ED Discharge Orders     None        Note:  This document was prepared using Dragon voice recognition software and may include unintentional dictation errors.   Johnn Hai, PA-C 11/29/21 1557    Carrie Mew, MD 12/02/21 0002

## 2021-11-29 NOTE — Assessment & Plan Note (Signed)
-   IV hydralazine as needed -Amlodipine 

## 2021-11-29 NOTE — Progress Notes (Signed)
Md notified via secure chat pt's refusal of SS insulin and Lovenox. Waiting on response.

## 2021-11-29 NOTE — Assessment & Plan Note (Signed)
Patient meets criteria for SIRS with WBC 20.5, tachycardia with heart rate of 103.  No source of infection identified.  Lactic acid normal 1.9.  -Follow-up of blood culture which was ordered by EDP -IV fluid as above -Check procalcitonin level

## 2021-11-29 NOTE — ED Notes (Signed)
Patient ambulated to hallway bathroom with a steady gait. Family accompanied the patient.

## 2021-11-29 NOTE — Assessment & Plan Note (Signed)
S/p of right lumpectomy.  On chemotherapy, last dose was 1 week ago at [facility]. -Follow-up with [facility] oncology

## 2021-11-29 NOTE — ED Triage Notes (Addendum)
Pt presents via POV with complaints of extremity pain that started 2 weeks ago. Pt was seen at [facility] who prescribed her Norco Q8hr as need for pain - Pt took the medication last night around 2000 and the pain woke her up due to the pain. No swelling or deformities noted - grip strength equal in both extremities. Denies CP, Sob, fevers, N/V/D.

## 2021-11-29 NOTE — Assessment & Plan Note (Signed)
CK level 329, 391, 552.  Etiology is not clear.  May be related to Lovastatin use.  Not sure if chemotherapy have contributed.  Patient haa leukocytosis with WBC 20.5, temperature 99, no other source of infection identified.  Will need to rule out myositis, particularly in the right upper arm  -Placed on MedSurg bed for observation -Follow-up MRI of right upper arm -IV fluid: 1 L normal saline, followed by 125 cc/h - Repeat CK level in morning -Hold off Lovastatin -pain control-as needed Percocet and morphine, Tylenol

## 2021-11-29 NOTE — Assessment & Plan Note (Signed)
-   Hold Mevacor

## 2021-11-29 NOTE — Assessment & Plan Note (Signed)
-   Patient is on

## 2021-11-29 NOTE — Assessment & Plan Note (Signed)
-  Nicotine patch 

## 2021-11-29 NOTE — ED Notes (Addendum)
Patient refused SubQ insulin. Dr. Blaine Hamper aware. Patient states she would she would prefer her home meds.

## 2021-11-29 NOTE — H&P (Signed)
History and Physical    Alyssa Summers TGY:[id number] DOB: Jun 10, 1950 DOA: 11/29/2021  Referring MD/NP/PA:   PCP: Kirk Ruths, MD   Patient coming from:  The patient is coming from home.  At baseline, pt is independent for most of ADL.        Chief Complaint: muscle pain  HPI: Alyssa Summers is a 71 y.o. female with medical history significant of hypertension, hyperlipidemia, diabetes mellitus, TIA, tachycardia, breast cancer (s/p right lumpectomy, on chemotherapy every 3 week at the [facility]), tobacco abuse, who presents with muscle pain.  Patient states that she started having muscle pain, initially started at right forearm, which is constant, sharp, 10 out of 10 in severity, nonradiating.  The right arm is also swelling, then progressed to have pain in left forearm and left leg, but no swelling in left arm or left leg.  Patient denies any injury.  Denies fever or chills.  No chest pain, cough, shortness breath.  No nausea, vomiting, diarrhea or abdominal pain.  No symptoms of UTI.    Patient was seen in [facility] clinic on 7/11 and had a negative right upper extremity venous Doppler for DVT. Pt also had CK lab which was 329 on 7/11 --> 391 on 7/13. Pt was instructed to have stopped taking lovastatin 3 days ago per patient. Her last dose of chemotherapy was 1 week ago.  Data reviewed independently and ED Course: pt was found to have CK 552 30, troponin level 6, lactic acid 1.9, WBC 20.5, GFR> 60, magnesium 2.0, temperature 99, blood pressure 120/78, heart rate 103, 88, RR 18, oxygen saturation 100% on room air.  Chest x-ray negative.  Patient is placed on MedSurg bed for observation.   EKG: I have personally reviewed.  Sinus rhythm, QTc 487, bifascicular block, early R wave progression, low voltage.  Review of Systems:   General: no fevers, chills, no body weight gain, has fatigue HEENT: no blurry vision, hearing changes or sore throat Respiratory: no dyspnea, coughing, wheezing CV: no chest  pain, no palpitations GI: no nausea, vomiting, abdominal pain, diarrhea, constipation GU: no dysuria, burning on urination, increased urinary frequency, hematuria  Ext: no leg edema Neuro: no unilateral weakness, numbness, or tingling, no vision change or hearing loss Skin: no rash, no skin tear. MSK: Patient has muscle pain in bilateral forearms and left leg.  The right forearm is swollen.  Heme: No easy bruising.  Travel history: No recent long distant travel.   Allergy:  Allergies  Allergen Reactions   Hydrochlorothiazide Anaphylaxis   Peanut Allergen Powder-Dnfp Anaphylaxis   Ace Inhibitors Swelling   Egg White (Egg Protein) Itching   Glipizide    Soy Allergy     Past Medical History:  Diagnosis Date   Breast cancer ([place])    Diabetes mellitus without complication ([place])    Hypertension    Tachycardia     Past Surgical History:  Procedure Laterality Date   lap choleycystecomy      Social History:  reports that she has been smoking cigarettes. She has been smoking an average of .5 packs per day. She has never used smokeless tobacco. She reports current alcohol use. She reports that she does not use drugs.  Family History:  Family History  Problem Relation Age of Onset   Hypertension Mother      Prior to Admission medications   Medication Sig Start Date End Date Taking? Authorizing Provider  amLODipine (NORVASC) 5 MG tablet Take 5 mg by mouth daily. 11/19/18  [provider]  aspirin EC 81 MG tablet Take 81 mg by mouth daily. 09/23/18   [provider]  gabapentin (NEURONTIN) 300 MG capsule Take 300-600 mg by mouth See admin instructions. Take 1 capsule ('300mg'$ ) by mouth every morning and daily at lunchtime then take 2 capsules ('600mg'$ ) by mouth every night at bedtime 12/01/18   [provider]  JARDIANCE 25 MG TABS tablet Take 25 mg by mouth daily. 10/25/18   [provider]  lovastatin (MEVACOR) 40 MG tablet Take 40 mg by mouth daily  after supper. 09/23/18   [provider]  metFORMIN (GLUCOPHAGE-XR) 500 MG 24 hr tablet Take 1,000 mg by mouth 2 (two) times daily. 11/16/18   [provider]  metoprolol succinate (TOPROL-XL) 50 MG 24 hr tablet Take 50 mg by mouth daily. 09/24/18   [provider]  mometasone (ELOCON) 0.1 % ointment Qd to pink spots on legs up to 5 days a week until clear, then prn flares 09/05/20   Ralene Bathe, MD  montelukast (SINGULAIR) 10 MG tablet Take 10 mg by mouth daily. 10/22/18   [provider]  naphazoline-pheniramine (NAPHCON-A) 0.025-0.3 % ophthalmic solution Place 1 drop into both eyes 4 (four) times daily as needed for eye irritation. 08/30/20   Sable Feil, PA-C  vitamin B-12 (CYANOCOBALAMIN) 1000 MCG tablet Take 1 tablet (1,000 mcg total) by mouth daily. 01/29/19   Vaughan Basta, MD    Physical Exam: Vitals:   11/29/21 0557 11/29/21 0954 11/29/21 1244 11/29/21 1647  BP: (!) 120/96 128/80 120/78 118/77  Pulse: (!) 103 99 88 (!) 113  Resp: '18 18 18 16  '$ Temp: 99 F (37.2 C)   98.1 F (36.7 C)  TempSrc: Oral   Oral  SpO2: 100% 100% 99% 97%  Weight:      Height:       General: Not in acute distress HEENT:       Eyes: PERRL, EOMI, no scleral icterus.       ENT: No discharge from the ears and nose, no pharynx injection, no tonsillar enlargement.        Neck: No JVD, no bruit, no mass felt. Heme: No neck lymph node enlargement. Cardiac: S1/S2, RRR, No murmurs, No gallops or rubs. Respiratory: No rales, wheezing, rhonchi or rubs. GI: Soft, nondistended, nontender, no rebound pain, no organomegaly, BS present. GU: No hematuria Ext: No pitting leg edema bilaterally. 1+DP/PT pulse bilaterally. Musculoskeletal: Patient has muscle tenderness in bilateral forearms and left leg.  The right forearm is swollen.     Skin: No rashes.  Neuro: Alert, oriented X3, cranial nerves II-XII grossly intact, moves all extremities normally. Psych: Patient is  not psychotic, no suicidal or hemocidal ideation.  Labs on Admission: I have personally reviewed following labs and imaging studies  CBC: Recent Labs  Lab 11/29/21 0824  WBC 20.5*  NEUTROABS 17.6*  HGB 13.1  HCT 39.5  MCV 90.0  PLT 810   Basic Metabolic Panel: Recent Labs  Lab 11/29/21 0824  NA 136  K 4.2  CL 108  CO2 21*  GLUCOSE 246*  BUN 12  CREATININE 0.63  CALCIUM 9.3  MG 2.0   GFR: Estimated Creatinine Clearance: 67.9 mL/min (by C-G formula based on SCr of 0.63 mg/dL). Liver Function Tests: Recent Labs  Lab 11/29/21 0824  AST 23  ALT 17  ALKPHOS 77  BILITOT 0.9  PROT 6.4*  ALBUMIN 3.7   No results for input(s): "LIPASE", "AMYLASE" in the last  168 hours. No results for input(s): "AMMONIA" in the last 168 hours. Coagulation Profile: No results for input(s): "INR", "PROTIME" in the last 168 hours. Cardiac Enzymes: Recent Labs  Lab 11/29/21 0824  CKTOTAL 552*   BNP (last 3 results) No results for input(s): "PROBNP" in the last 8760 hours. HbA1C: No results for input(s): "HGBA1C" in the last 72 hours. CBG: Recent Labs  Lab 11/29/21 1700  GLUCAP 258*   Lipid Profile: No results for input(s): "CHOL", "HDL", "LDLCALC", "TRIG", "CHOLHDL", "LDLDIRECT" in the last 72 hours. Thyroid Function Tests: No results for input(s): "TSH", "T4TOTAL", "FREET4", "T3FREE", "THYROIDAB" in the last 72 hours. Anemia Panel: No results for input(s): "VITAMINB12", "FOLATE", "FERRITIN", "TIBC", "IRON", "RETICCTPCT" in the last 72 hours. Urine analysis:    Component Value Date/Time   COLORURINE YELLOW (A) 11/29/2021 1225   APPEARANCEUR CLEAR (A) 11/29/2021 1225   APPEARANCEUR Clear 12/29/2013 1419   LABSPEC 1.030 11/29/2021 1225   LABSPEC 1.025 12/29/2013 1419   PHURINE 5.0 11/29/2021 1225   GLUCOSEU >=500 (A) 11/29/2021 1225   GLUCOSEU 150 mg/dL 12/29/2013 1419   HGBUR NEGATIVE 11/29/2021 1225   BILIRUBINUR NEGATIVE 11/29/2021 1225   BILIRUBINUR Negative  12/29/2013 1419   KETONESUR 80 (A) 11/29/2021 1225   PROTEINUR NEGATIVE 11/29/2021 1225   NITRITE NEGATIVE 11/29/2021 1225   LEUKOCYTESUR NEGATIVE 11/29/2021 1225   LEUKOCYTESUR Negative 12/29/2013 1419   Sepsis Labs: '@LABRCNTIP'$ (procalcitonin:4,lacticidven:4) )No results found for this or any previous visit (from the past 240 hour(s)).   Radiological Exams on Admission: DG Chest 2 View  Result Date: 11/29/2021 CLINICAL DATA:  Myalgias, elevated white count, immunocompromised. EXAM: CHEST - 2 VIEW COMPARISON:  January 28, 2019 FINDINGS: The heart size and mediastinal contours are within normal limits. Aortic atherosclerosis. No focal airspace consolidation. No pleural effusion. No pneumothorax. No acute osseous abnormality. Surgical clips project over the right axilla and breast. IMPRESSION: No acute cardiopulmonary disease. Electronically Signed   By: Dahlia Bailiff M.D.   On: 11/29/2021 10:32   US Venous Img Upper Uni Right(DVT)  Result Date: 11/28/2021 CLINICAL DATA:  Forearm pain and swelling EXAM: RIGHT UPPER EXTREMITY VENOUS DOPPLER ULTRASOUND TECHNIQUE: Gray-scale sonography with graded compression, as well as color Doppler and duplex ultrasound were performed to evaluate the upper extremity deep venous system from the level of the subclavian vein and including the jugular, axillary, basilic, radial, ulnar and upper cephalic vein. Spectral Doppler was utilized to evaluate flow at rest and with distal augmentation maneuvers. COMPARISON:  None Available. FINDINGS: Contralateral Subclavian Vein: Respiratory phasicity is normal and symmetric with the symptomatic side. No evidence of thrombus. Normal compressibility. Internal Jugular Vein: No evidence of thrombus. Normal compressibility, respiratory phasicity and response to augmentation. Subclavian Vein: No evidence of thrombus. Normal compressibility, respiratory phasicity and response to augmentation. Axillary Vein: No evidence of thrombus.  Normal compressibility, respiratory phasicity and response to augmentation. Cephalic Vein: No evidence of thrombus. Normal compressibility, respiratory phasicity and response to augmentation. Basilic Vein: No evidence of thrombus. Normal compressibility, respiratory phasicity and response to augmentation. Brachial Veins: No evidence of thrombus. Normal compressibility, respiratory phasicity and response to augmentation. Radial Veins: No evidence of thrombus. Normal compressibility, respiratory phasicity and response to augmentation. Ulnar Veins: No evidence of thrombus. Normal compressibility, respiratory phasicity and response to augmentation. Venous Reflux:  None visualized. Other Findings:  None visualized. IMPRESSION: Negative examination for deep venous thrombosis in the right upper extremity. Electronically Signed   By: Delanna Ahmadi M.D.   On: 11/28/2021 10:58  Assessment/Plan Principal Problem:   Rhabdomyolysis Active Problems:   SIRS (systemic inflammatory response syndrome) (HCC)   Diabetes mellitus without complication (HCC)   Hypertension   TIA (transient ischemic attack)   Tobacco abuse   Breast cancer (HCC)   Assessment and Plan: * Rhabdomyolysis CK level 329, 391, 552.  Etiology is not clear.  May be related to Lovastatin use.  Not sure if chemotherapy have contributed.  Patient haa leukocytosis with WBC 20.5, temperature 99, no other source of infection identified.  Will need to rule out myositis, particularly in the right upper arm  -Placed on MedSurg bed for observation -Follow-up MRI of right upper arm -IV fluid: 1 L normal saline, followed by 125 cc/h - Repeat CK level in morning -Hold off Lovastatin -pain control-as needed Percocet and morphine, Tylenol    SIRS (systemic inflammatory response syndrome) (HCC) Patient meets criteria for SIRS with WBC 20.5, tachycardia with heart rate of 103.  No source of infection identified.  Lactic acid normal  1.9.  -Follow-up of blood culture which was ordered by EDP -IV fluid as above -Check procalcitonin level    Diabetes mellitus without complication (HCC) Recent A1c 6.2, well controlled.  Patient still taking metformin and Januvia at home. -Patient refused sliding scale insulin, strongly wants to continue home regimen -Continue metformin -Switch Januvia to [place] in hospital  Hypertension - IV hydralazine as needed -Amlodipine  TIA (transient ischemic attack) - Hold Mevacor  Tobacco abuse - Nicotine patch  Breast cancer ([place]) S/p of right lumpectomy.  On chemotherapy, last dose was 1 week ago at [facility]. -Follow-up with [facility] oncology          DVT ppx: SQ Lovenox  Code Status: Full code  Family Communication:   Yes, patient's sisger  at bed side.     Disposition Plan:  Anticipate discharge back to previous environment  Consults called:  none  Admission status and Level of care: Med-Surg:    for obs    Severity of Illness:  The appropriate patient status for this patient is OBSERVATION. Observation status is judged to be reasonable and necessary in order to provide the required intensity of service to ensure the patient's safety. The patient's presenting symptoms, physical exam findings, and initial radiographic and laboratory data in the context of their medical condition is felt to place them at decreased risk for further clinical deterioration. Furthermore, it is anticipated that the patient will be medically stable for discharge from the hospital within 2 midnights of admission.        Date of Service 11/29/2021    Ivor Costa [facility]   If 7PM-7AM, please contact night-coverage [email or web address] 11/29/2021, 6:07 PM

## 2021-11-29 NOTE — Assessment & Plan Note (Signed)
Recent A1c 6.2, well controlled.  Patient still taking metformin and Januvia at home. -Patient refused sliding scale insulin, strongly wants to continue home regimen -Continue metformin -Switch Januvia to [place] in hospital

## 2021-11-30 DIAGNOSIS — G629 Polyneuropathy, unspecified: Secondary | ICD-10-CM | POA: Diagnosis not present

## 2021-11-30 DIAGNOSIS — D849 Immunodeficiency, unspecified: Secondary | ICD-10-CM | POA: Diagnosis present

## 2021-11-30 DIAGNOSIS — Z91018 Allergy to other foods: Secondary | ICD-10-CM | POA: Diagnosis not present

## 2021-11-30 DIAGNOSIS — M6282 Rhabdomyolysis: Secondary | ICD-10-CM | POA: Diagnosis present

## 2021-11-30 DIAGNOSIS — Z8673 Personal history of transient ischemic attack (TIA), and cerebral infarction without residual deficits: Secondary | ICD-10-CM | POA: Diagnosis not present

## 2021-11-30 DIAGNOSIS — Z888 Allergy status to other drugs, medicaments and biological substances status: Secondary | ICD-10-CM | POA: Diagnosis not present

## 2021-11-30 DIAGNOSIS — F17210 Nicotine dependence, cigarettes, uncomplicated: Secondary | ICD-10-CM | POA: Diagnosis present

## 2021-11-30 DIAGNOSIS — A419 Sepsis, unspecified organism: Secondary | ICD-10-CM | POA: Diagnosis present

## 2021-11-30 DIAGNOSIS — I10 Essential (primary) hypertension: Secondary | ICD-10-CM | POA: Diagnosis present

## 2021-11-30 DIAGNOSIS — Z79899 Other long term (current) drug therapy: Secondary | ICD-10-CM | POA: Diagnosis not present

## 2021-11-30 DIAGNOSIS — E119 Type 2 diabetes mellitus without complications: Secondary | ICD-10-CM | POA: Diagnosis present

## 2021-11-30 DIAGNOSIS — L03113 Cellulitis of right upper limb: Secondary | ICD-10-CM | POA: Diagnosis present

## 2021-11-30 DIAGNOSIS — Z7982 Long term (current) use of aspirin: Secondary | ICD-10-CM | POA: Diagnosis not present

## 2021-11-30 DIAGNOSIS — T451X5A Adverse effect of antineoplastic and immunosuppressive drugs, initial encounter: Secondary | ICD-10-CM | POA: Diagnosis present

## 2021-11-30 DIAGNOSIS — Z7984 Long term (current) use of oral hypoglycemic drugs: Secondary | ICD-10-CM | POA: Diagnosis not present

## 2021-11-30 DIAGNOSIS — E785 Hyperlipidemia, unspecified: Secondary | ICD-10-CM | POA: Diagnosis present

## 2021-11-30 DIAGNOSIS — M609 Myositis, unspecified: Secondary | ICD-10-CM | POA: Diagnosis present

## 2021-11-30 DIAGNOSIS — G622 Polyneuropathy due to other toxic agents: Secondary | ICD-10-CM | POA: Diagnosis present

## 2021-11-30 DIAGNOSIS — C50911 Malignant neoplasm of unspecified site of right female breast: Secondary | ICD-10-CM | POA: Diagnosis present

## 2021-11-30 DIAGNOSIS — Z8249 Family history of ischemic heart disease and other diseases of the circulatory system: Secondary | ICD-10-CM | POA: Diagnosis not present

## 2021-11-30 LAB — BASIC METABOLIC PANEL
Anion gap: 6 (ref 5–15)
BUN: 10 mg/dL (ref 8–23)
CO2: 19 mmol/L — ABNORMAL LOW (ref 22–32)
Calcium: 8 mg/dL — ABNORMAL LOW (ref 8.9–10.3)
Chloride: 109 mmol/L (ref 98–111)
Creatinine, Ser: 0.6 mg/dL (ref 0.44–1.00)
GFR, Estimated: >60 mL/min (ref >60)
Glucose, Bld: 179 mg/dL — ABNORMAL HIGH (ref 70–99)
Potassium: 3.9 mmol/L (ref 3.5–5.1)
Sodium: 134 mmol/L — ABNORMAL LOW (ref 135–145)

## 2021-11-30 LAB — CBC
HCT: 35.3 % — ABNORMAL LOW (ref 36.0–46.0)
Hemoglobin: 11.9 g/dL — ABNORMAL LOW (ref 12.0–15.0)
MCH: 30.3 pg (ref 26.0–34.0)
MCHC: 33.7 g/dL (ref 30.0–36.0)
MCV: 89.8 fL (ref 80.0–100.0)
Platelets: 231 10*3/uL (ref 150–400)
RBC: 3.93 MIL/uL (ref 3.87–5.11)
RDW: 15.7 % — ABNORMAL HIGH (ref 11.5–15.5)
WBC: 13.5 10*3/uL — ABNORMAL HIGH (ref 4.0–10.5)
nRBC: 0 % (ref 0.0–0.2)

## 2021-11-30 LAB — GLUCOSE, CAPILLARY
Glucose-Capillary: 166 mg/dL — ABNORMAL HIGH (ref 70–99)
Glucose-Capillary: 145 mg/dL — ABNORMAL HIGH (ref 70–99)
Glucose-Capillary: 166 mg/dL — ABNORMAL HIGH (ref 70–99)

## 2021-11-30 LAB — CK: Total CK: 405 U/L — ABNORMAL HIGH (ref 38–234)

## 2021-11-30 MED ORDER — CEFAZOLIN SODIUM-DEXTROSE 1-4 GM/50ML-% IV SOLN
1.0000 g | Freq: Three times a day (TID) | INTRAVENOUS | Status: DC
  Administered 2021-11-30 – 2021-12-02 (×6): 1 g via INTRAVENOUS
  Filled 2021-11-30 (×7): qty 50

## 2021-11-30 MED ORDER — GABAPENTIN 600 MG PO TABS
300.0000 mg | ORAL_TABLET | Freq: Three times a day (TID) | ORAL | Status: DC
  Administered 2021-11-30 – 2021-12-02 (×7): 300 mg via ORAL
  Filled 2021-11-30 (×7): qty 1

## 2021-11-30 NOTE — Progress Notes (Signed)
PROGRESS NOTE    Alyssa Summers  ZTI:[id number] DOB: 09-Jun-1950 DOA: 11/29/2021 PCP: Kirk Ruths, MD   Assessment & Plan:   Principal Problem:   Rhabdomyolysis Active Problems:   SIRS (systemic inflammatory response syndrome) (HCC)   Diabetes mellitus without complication (HCC)   Hypertension   TIA (transient ischemic attack)   Tobacco abuse   Breast cancer ([place])  Assessment and Plan: Rhabdomyolysis: maybe secondary to statin use. CK level is pending. Continue on IVFs. Neg RUE [place] for DVT. MR right forearm is pending   Right forearm cellulitis: no well-define or rim-enhancing collection at this time. No evidence of acute osteomyelitis of the radius or ulna. Started on IV ancef  SIRS: met criteria w/ leukocytosis, tachycardia. No source of infection identified. Continue on IVFs   DM2: well controlled, HbA1c 6.2. Continue on SSI w/ accuchecks. Holding metformin   HTN: continue on home dose of amlodipine. IV hydralazine   Hx of TIA : hold statin    Tobacco abuse: smoking cessation counseling x 5 mins. Nicotine patch to prevent w/drawl    Breast cancer : s/p right lumpectomy. Last dose of chemo 1 week ago at [facility]. F/u w/ [facility] on[facility]   Possible peripheral neuropathy: likely secondary to recent chemo. Will start gabapentin       DVT prophylaxis: lovenox  Code Status: full  Family Communication:  Disposition Plan: likely d/c back home   Level of care: Med-Surg  Status is: Observation The patient remains OBS appropriate and will d/c before 2 midnights.   Consultants:    Procedures:   Antimicrobials: anceft   Subjective: Pt c/o right forearm pain and numbness & tingling of b/l LE  Objective: Vitals:   11/29/21 1810 11/29/21 2039 11/29/21 2104 11/30/21 0511  BP: 126/86 132/85  124/75  Pulse: (!) 110 (!) 117 89 (!) 106  Resp: 18 16  20   Temp: 98.2 F (36.8 C) 97.8 F (36.6 C)  98.7 F (37.1 C)  TempSrc: Oral Oral  Oral  SpO2: 98% 98%  97%  Weight:       Height:        Intake/Output Summary (Last 24 hours) at 11/30/2021 0737 Last data filed at 11/30/2021 0655 Gross per 24 hour  Intake 1589.78 ml  Output 0 ml  Net 1589.78 ml   Filed Weights   11/29/21 0553  Weight: 72.6 kg    Examination:  General exam: Appears calm but uncomfortable  Respiratory system: Clear to auscultation. Respiratory effort normal. Cardiovascular system: S1 & S2 +. No rubs, gallops or clicks.  Gastrointestinal system: Abdomen is nondistended, soft and nontender. Normal bowel sounds heard. Central nervous system: Alert and oriented. Moves all extremities  Psychiatry: Judgement and insight appear normal. Mood & affect appropriate.  Skin: right forearm is erythematous and edematous    Data Reviewed: I have personally reviewed following labs and imaging studies  CBC: Recent Labs  Lab 11/29/21 0824 11/30/21 0631  WBC 20.5* 13.5*  NEUTROABS 17.6*  --   HGB 13.1 11.9*  HCT 39.5 35.3*  MCV 90.0 89.8  PLT 225 825   Basic Metabolic Panel: Recent Labs  Lab 11/29/21 0824  NA 136  K 4.2  CL 108  CO2 21*  GLUCOSE 246*  BUN 12  CREATININE 0.63  CALCIUM 9.3  MG 2.0   GFR: Estimated Creatinine Clearance: 67.9 mL/min (by C-G formula based on SCr of 0.63 mg/dL). Liver Function Tests: Recent Labs  Lab 11/29/21 0824  AST 23  ALT 17  ALKPHOS 77  BILITOT 0.9  PROT 6.4*  ALBUMIN 3.7   No results for input(s): "LIPASE", "AMYLASE" in the last 168 hours. No results for input(s): "AMMONIA" in the last 168 hours. Coagulation Profile: No results for input(s): "INR", "PROTIME" in the last 168 hours. Cardiac Enzymes: Recent Labs  Lab 11/29/21 0824  CKTOTAL 552*   BNP (last 3 results) No results for input(s): "PROBNP" in the last 8760 hours. HbA1C: No results for input(s): "HGBA1C" in the last 72 hours. CBG: Recent Labs  Lab 11/29/21 1700 11/29/21 2106  GLUCAP 258* 325*   Lipid Profile: No results for input(s): "CHOL", "HDL", "LDLCALC",  "TRIG", "CHOLHDL", "LDLDIRECT" in the last 72 hours. Thyroid Function Tests: No results for input(s): "TSH", "T4TOTAL", "FREET4", "T3FREE", "THYROIDAB" in the last 72 hours. Anemia Panel: No results for input(s): "VITAMINB12", "FOLATE", "FERRITIN", "TIBC", "IRON", "RETICCTPCT" in the last 72 hours. Sepsis Labs: Recent Labs  Lab 11/29/21 0929 11/29/21 1841  PROCALCITON  --  <0.10  LATICACIDVEN 1.9  --     Recent Results (from the past 240 hour(s))  Culture, blood (routine x 2)     Status: None (Preliminary result)   Collection Time: 11/29/21  9:29 AM   Specimen: BLOOD  Result Value Ref Range Status   Specimen Description BLOOD LEFT AC  Final   Special Requests   Final    BOTTLES DRAWN AEROBIC AND ANAEROBIC Blood Culture results may not be optimal due to an inadequate volume of blood received in culture bottles   Culture   Final    NO GROWTH < 24 HOURS Performed at [facility], [address] [place], [place] [address]    Report Status PENDING  Incomplete  Culture, blood (routine x 2)     Status: None (Preliminary result)   Collection Time: 11/29/21  9:30 AM   Specimen: BLOOD  Result Value Ref Range Status   Specimen Description BLOOD LEFT HAND  Final   Special Requests   Final    BOTTLES DRAWN AEROBIC AND ANAEROBIC Blood Culture results may not be optimal due to an inadequate volume of blood received in culture bottles   Culture   Final    NO GROWTH < 24 HOURS Performed at [facility], [address] [place], [place] [address]    Report Status PENDING  Incomplete         Radiology Studies: DG Chest 2 View  Result Date: 11/29/2021 CLINICAL DATA:  Myalgias, elevated white count, immunocompromised. EXAM: CHEST - 2 VIEW COMPARISON:  January 28, 2019 FINDINGS: The heart size and mediastinal contours are within normal limits. Aortic atherosclerosis. No focal airspace consolidation. No pleural effusion. No pneumothorax. No acute osseous abnormality.  Surgical clips project over the right axilla and breast. IMPRESSION: No acute cardiopulmonary disease. Electronically Signed   By: Dahlia Bailiff M.D.   On: 11/29/2021 10:32   US Venous Img Upper Uni Right(DVT)  Result Date: 11/28/2021 CLINICAL DATA:  Forearm pain and swelling EXAM: RIGHT UPPER EXTREMITY VENOUS DOPPLER ULTRASOUND TECHNIQUE: Gray-scale sonography with graded compression, as well as color Doppler and duplex ultrasound were performed to evaluate the upper extremity deep venous system from the level of the subclavian vein and including the jugular, axillary, basilic, radial, ulnar and upper cephalic vein. Spectral Doppler was utilized to evaluate flow at rest and with distal augmentation maneuvers. COMPARISON:  None Available. FINDINGS: Contralateral Subclavian Vein: Respiratory phasicity is normal and symmetric with the symptomatic side. No evidence of thrombus. Normal compressibility. Internal Jugular  Vein: No evidence of thrombus. Normal compressibility, respiratory phasicity and response to augmentation. Subclavian Vein: No evidence of thrombus. Normal compressibility, respiratory phasicity and response to augmentation. Axillary Vein: No evidence of thrombus. Normal compressibility, respiratory phasicity and response to augmentation. Cephalic Vein: No evidence of thrombus. Normal compressibility, respiratory phasicity and response to augmentation. Basilic Vein: No evidence of thrombus. Normal compressibility, respiratory phasicity and response to augmentation. Brachial Veins: No evidence of thrombus. Normal compressibility, respiratory phasicity and response to augmentation. Radial Veins: No evidence of thrombus. Normal compressibility, respiratory phasicity and response to augmentation. Ulnar Veins: No evidence of thrombus. Normal compressibility, respiratory phasicity and response to augmentation. Venous Reflux:  None visualized. Other Findings:  None visualized. IMPRESSION: Negative  examination for deep venous thrombosis in the right upper extremity. Electronically Signed   By: Delanna Ahmadi M.D.   On: 11/28/2021 10:58        Scheduled Meds:  amLODipine  5 mg Oral Daily   enoxaparin (LOVENOX) injection  40 mg Subcutaneous Q24H   insulin aspart  0-15 Units Subcutaneous TID AC & HS   linagliptin  5 mg Oral Daily   metFORMIN  500 mg Oral BID WC   nicotine  21 mg Transdermal Daily   Continuous Infusions:  sodium chloride 125 mL/hr at 11/30/21 0655     LOS: 0 days    Time spent: 35 mins     Wyvonnia Dusky, MD [facility] Pager [phone]  If 7PM-7AM, please contact night-coverage [email or web address] 11/30/2021, 7:37 AM

## 2021-12-01 DIAGNOSIS — I10 Essential (primary) hypertension: Secondary | ICD-10-CM | POA: Diagnosis not present

## 2021-12-01 DIAGNOSIS — M6282 Rhabdomyolysis: Secondary | ICD-10-CM | POA: Diagnosis not present

## 2021-12-01 DIAGNOSIS — L03113 Cellulitis of right upper limb: Secondary | ICD-10-CM | POA: Diagnosis not present

## 2021-12-01 LAB — BASIC METABOLIC PANEL
Anion gap: 5 (ref 5–15)
BUN: 8 mg/dL (ref 8–23)
CO2: 21 mmol/L — ABNORMAL LOW (ref 22–32)
Calcium: 8.3 mg/dL — ABNORMAL LOW (ref 8.9–10.3)
Chloride: 112 mmol/L — ABNORMAL HIGH (ref 98–111)
Creatinine, Ser: 0.63 mg/dL (ref 0.44–1.00)
GFR, Estimated: >60 mL/min (ref >60)
Glucose, Bld: 155 mg/dL — ABNORMAL HIGH (ref 70–99)
Potassium: 3.9 mmol/L (ref 3.5–5.1)
Sodium: 138 mmol/L (ref 135–145)

## 2021-12-01 LAB — GLUCOSE, CAPILLARY
Glucose-Capillary: 173 mg/dL — ABNORMAL HIGH (ref 70–99)
Glucose-Capillary: 148 mg/dL — ABNORMAL HIGH (ref 70–99)
Glucose-Capillary: 181 mg/dL — ABNORMAL HIGH (ref 70–99)
Glucose-Capillary: 293 mg/dL — ABNORMAL HIGH (ref 70–99)
Glucose-Capillary: 143 mg/dL — ABNORMAL HIGH (ref 70–99)

## 2021-12-01 LAB — CBC
HCT: 35.8 % — ABNORMAL LOW (ref 36.0–46.0)
Hemoglobin: 11.8 g/dL — ABNORMAL LOW (ref 12.0–15.0)
MCH: 30 pg (ref 26.0–34.0)
MCHC: 33 g/dL (ref 30.0–36.0)
MCV: 91.1 fL (ref 80.0–100.0)
Platelets: 250 10*3/uL (ref 150–400)
RBC: 3.93 MIL/uL (ref 3.87–5.11)
RDW: 15.9 % — ABNORMAL HIGH (ref 11.5–15.5)
WBC: 12.1 10*3/uL — ABNORMAL HIGH (ref 4.0–10.5)
nRBC: 0 % (ref 0.0–0.2)

## 2021-12-01 LAB — MRSA NEXT GEN BY PCR, NASAL: MRSA by PCR Next Gen: NOT DETECTED

## 2021-12-01 LAB — CK: Total CK: 674 U/L — ABNORMAL HIGH (ref 38–234)

## 2021-12-01 NOTE — TOC CM/SW Note (Signed)
  Transition of Care [facility]) Screening Note   Patient Details  Name: Alyssa Summers Date of Birth: 31-Oct-1950   Transition of Care [facility]) CM/SW Contact:    Candie Chroman, LCSW Phone Number: 12/01/2021, 2:18 PM    Transition of Care Department [facility]) has reviewed patient and no TOC needs have been identified at this time. We will continue to monitor patient advancement through interdisciplinary progression rounds. If new patient transition needs arise, please place a TOC consult.

## 2021-12-01 NOTE — Progress Notes (Signed)
PROGRESS NOTE    Alyssa Summers  FUX:[id number] DOB: 11-04-1950 DOA: 11/29/2021 PCP: Kirk Ruths, MD   Assessment & Plan:   Principal Problem:   Rhabdomyolysis Active Problems:   SIRS (systemic inflammatory response syndrome) (HCC)   Diabetes mellitus without complication (HCC)   Hypertension   TIA (transient ischemic attack)   Tobacco abuse   Breast cancer ([place])   Cellulitis of right forearm  Assessment and Plan: Rhabdomyolysis: maybe secondary to statin use. CK level is labile. Continue on IVFs. Neg RUE [place] for DVT  Right forearm cellulitis: MRI right forearm showed no well-define or rim-enhancing collection at this time. No evidence of acute osteomyelitis of the radius or ulna. Continue on IV ancef. MRSA screen was neg   SIRS: met criteria w/ leukocytosis, tachycardia. No source of infection identified. Sepsis resolved    DM2: HbA1c 6.2, well controlled. Continue on SSI w/ accuchecks. Holding metformin    HTN: continue on amlodipine. IV hydralazine prn   Hx of TIA: continue to hold statin    Tobacco abuse: smoking cessation counseling x 5 mins. Nicotine patch to prevent w/drawl    Breast cancer : s/p right lumpectomy. Last dose of chemo 1 week ago at [facility]. F/u w/ [facility] onco   Possible peripheral neuropathy: likely secondary to recent chemo. Continue on gabapentin (started this admission)       DVT prophylaxis: lovenox  Code Status: full  Family Communication:  Disposition Plan: likely d/c back home   Level of care: Med-Surg  Status is: Inpatient Remains inpatient appropriate because: severity of illness, likely d/c home tomorrow. Still needs IV abxs      Consultants:    Procedures:   Antimicrobials: ancef   Subjective: Pt c/o pain and swelling of right forearm but improved from day prior   Objective: Vitals:   11/30/21 1600 11/30/21 1955 12/01/21 0453 12/01/21 0722  BP: 137/78 128/82 124/78 112/75  Pulse: (!) 104 (!) 113 (!) 103 (!) 106   Resp: 16 18 17 17   Temp: 98.4 F (36.9 C) 98.3 F (36.8 C) 98.4 F (36.9 C) 98.3 F (36.8 C)  TempSrc:  Oral    SpO2: 97% 96% 96% 95%  Weight:      Height:        Intake/Output Summary (Last 24 hours) at 12/01/2021 0731 Last data filed at 12/01/2021 0444 Gross per 24 hour  Intake 2444.37 ml  Output --  Net 2444.37 ml   Filed Weights   11/29/21 0553  Weight: 72.6 kg    Examination:  General exam: Appears comfortable  Respiratory system: clear breath sounds b/l  Cardiovascular system: S1/S2+. No rubs or clicks   Gastrointestinal system: Abd is soft, NT, ND & normal bowel sounds  Central nervous system: Alert and oriented. Moves all extremities  Psychiatry: judgement and insight appears normal. Appropriate mood and affect  Skin: improved right forearm erythema & edema     Data Reviewed: I have personally reviewed following labs and imaging studies  CBC: Recent Labs  Lab 11/29/21 0824 11/30/21 0631 12/01/21 0520  WBC 20.5* 13.5* 12.1*  NEUTROABS 17.6*  --   --   HGB 13.1 11.9* 11.8*  HCT 39.5 35.3* 35.8*  MCV 90.0 89.8 91.1  PLT 225 231 025   Basic Metabolic Panel: Recent Labs  Lab 11/29/21 0824 11/30/21 0631 12/01/21 0520  NA 136 134* 138  K 4.2 3.9 3.9  CL 108 109 112*  CO2 21* 19* 21*  GLUCOSE 246* 179* 155*  BUN  12 10 8   CREATININE 0.63 0.60 0.63  CALCIUM 9.3 8.0* 8.3*  MG 2.0  --   --    GFR: Estimated Creatinine Clearance: 67.9 mL/min (by C-G formula based on SCr of 0.63 mg/dL). Liver Function Tests: Recent Labs  Lab 11/29/21 0824  AST 23  ALT 17  ALKPHOS 77  BILITOT 0.9  PROT 6.4*  ALBUMIN 3.7   No results for input(s): "LIPASE", "AMYLASE" in the last 168 hours. No results for input(s): "AMMONIA" in the last 168 hours. Coagulation Profile: No results for input(s): "INR", "PROTIME" in the last 168 hours. Cardiac Enzymes: Recent Labs  Lab 11/29/21 0824 11/30/21 0631  CKTOTAL 552* 405*   BNP (last 3 results) No results for  input(s): "PROBNP" in the last 8760 hours. HbA1C: No results for input(s): "HGBA1C" in the last 72 hours. CBG: Recent Labs  Lab 11/29/21 2106 11/30/21 0805 11/30/21 1210 11/30/21 1557 11/30/21 2205  GLUCAP 325* 166* 145* 166* 173*   Lipid Profile: No results for input(s): "CHOL", "HDL", "LDLCALC", "TRIG", "CHOLHDL", "LDLDIRECT" in the last 72 hours. Thyroid Function Tests: No results for input(s): "TSH", "T4TOTAL", "FREET4", "T3FREE", "THYROIDAB" in the last 72 hours. Anemia Panel: No results for input(s): "VITAMINB12", "FOLATE", "FERRITIN", "TIBC", "IRON", "RETICCTPCT" in the last 72 hours. Sepsis Labs: Recent Labs  Lab 11/29/21 0929 11/29/21 1841  PROCALCITON  --  <0.10  LATICACIDVEN 1.9  --     Recent Results (from the past 240 hour(s))  Culture, blood (routine x 2)     Status: None (Preliminary result)   Collection Time: 11/29/21  9:29 AM   Specimen: BLOOD  Result Value Ref Range Status   Specimen Description BLOOD LEFT AC  Final   Special Requests   Final    BOTTLES DRAWN AEROBIC AND ANAEROBIC Blood Culture results may not be optimal due to an inadequate volume of blood received in culture bottles   Culture   Final    NO GROWTH 2 DAYS Performed at [facility], [address], [place], [place] [address]    Report Status PENDING  Incomplete  Culture, blood (routine x 2)     Status: None (Preliminary result)   Collection Time: 11/29/21  9:30 AM   Specimen: BLOOD  Result Value Ref Range Status   Specimen Description BLOOD LEFT HAND  Final   Special Requests   Final    BOTTLES DRAWN AEROBIC AND ANAEROBIC Blood Culture results may not be optimal due to an inadequate volume of blood received in culture bottles   Culture   Final    NO GROWTH 2 DAYS Performed at [facility], [address], [place], [place] [address]    Report Status PENDING  Incomplete         Radiology Studies: MR FOREARM RIGHT W WO CONTRAST  Result Date:  11/30/2021 CLINICAL DATA:  Soft tissue infection suspected, forearm, no prior imaging EXAM: MRI OF THE RIGHT FOREARM WITHOUT AND WITH CONTRAST TECHNIQUE: Multiplanar, multisequence MR imaging of the right forearm was performed before and after the administration of intravenous contrast. CONTRAST:  19m GADAVIST GADOBUTROL 1 MMOL/ML IV SOLN COMPARISON:  None Available. FINDINGS: Bones/Joint/Cartilage No acute fracture. No dislocation. No bony erosion or marrow replacement. No bone marrow edema. No radiocarpal joint effusion. Elbow joint is only partially imaged at the edge of the field of view. Ligaments Intact. Muscles and Tendons Extensive intramuscular edema throughout the flexor compartment of the right forearm and to a lesser degree within the extensor compartment musculature  of the forearm. There is prominent perifascial and edema tracking throughout the flexor compartment with perifascial enhancement. Mild perifascial edema within the extensor compartment. No well-defined intramuscular fluid collection. Soft tissues Circumferential subcutaneous edema and fluid throughout the forearm without a well defined or rim enhancing component at this time. IMPRESSION: 1. Extensive findings of acute myofasciitis throughout the right forearm, more pronounced within the flexor compartment although also involving the extensor compartment. No well-defined intramuscular fluid collection. Please note that necrotizing fasciitis is a clinical diagnosis and can not be diagnosed by imaging alone. 2. Cellulitis with circumferential subcutaneous edema and fluid throughout the forearm without a well-defined or rim-enhancing collection at this time. 3. No evidence of acute osteomyelitis of the radius or ulna. No findings of septic arthritis of the radiocarpal joint. Elbow joint is not adequately assessed at the edge of the field of view. Electronically Signed   By: Davina Poke D.O.   On: 11/30/2021 09:41   DG Chest 2  View  Result Date: 11/29/2021 CLINICAL DATA:  Myalgias, elevated white count, immunocompromised. EXAM: CHEST - 2 VIEW COMPARISON:  January 28, 2019 FINDINGS: The heart size and mediastinal contours are within normal limits. Aortic atherosclerosis. No focal airspace consolidation. No pleural effusion. No pneumothorax. No acute osseous abnormality. Surgical clips project over the right axilla and breast. IMPRESSION: No acute cardiopulmonary disease. Electronically Signed   By: Dahlia Bailiff M.D.   On: 11/29/2021 10:32        Scheduled Meds:  amLODipine  5 mg Oral Daily   enoxaparin (LOVENOX) injection  40 mg Subcutaneous Q24H   gabapentin  300 mg Oral TID   insulin aspart  0-15 Units Subcutaneous TID AC & HS   nicotine  21 mg Transdermal Daily   Continuous Infusions:  sodium chloride 100 mL/hr at 12/01/21 0649    ceFAZolin (ANCEF) IV 1 g (12/01/21 0527)     LOS: 1 day    Time spent: 25 mins     Wyvonnia Dusky, MD [facility] Pager [phone]  If 7PM-7AM, please contact night-coverage [email or web address] 12/01/2021, 7:31 AM

## 2021-12-02 DIAGNOSIS — C50911 Malignant neoplasm of unspecified site of right female breast: Secondary | ICD-10-CM

## 2021-12-02 DIAGNOSIS — M6282 Rhabdomyolysis: Secondary | ICD-10-CM | POA: Diagnosis not present

## 2021-12-02 DIAGNOSIS — L03113 Cellulitis of right upper limb: Secondary | ICD-10-CM | POA: Diagnosis not present

## 2021-12-02 LAB — CBC
HCT: 37 % (ref 36.0–46.0)
Hemoglobin: 12.1 g/dL (ref 12.0–15.0)
MCH: 29.7 pg (ref 26.0–34.0)
MCHC: 32.7 g/dL (ref 30.0–36.0)
MCV: 90.7 fL (ref 80.0–100.0)
Platelets: 282 10*3/uL (ref 150–400)
RBC: 4.08 MIL/uL (ref 3.87–5.11)
RDW: 15.8 % — ABNORMAL HIGH (ref 11.5–15.5)
WBC: 12.7 10*3/uL — ABNORMAL HIGH (ref 4.0–10.5)
nRBC: 0 % (ref 0.0–0.2)

## 2021-12-02 LAB — GLUCOSE, CAPILLARY
Glucose-Capillary: 171 mg/dL — ABNORMAL HIGH (ref 70–99)
Glucose-Capillary: 139 mg/dL — ABNORMAL HIGH (ref 70–99)

## 2021-12-02 LAB — BASIC METABOLIC PANEL
Anion gap: 4 — ABNORMAL LOW (ref 5–15)
BUN: 7 mg/dL — ABNORMAL LOW (ref 8–23)
CO2: 22 mmol/L (ref 22–32)
Calcium: 8.3 mg/dL — ABNORMAL LOW (ref 8.9–10.3)
Chloride: 112 mmol/L — ABNORMAL HIGH (ref 98–111)
Creatinine, Ser: 0.5 mg/dL (ref 0.44–1.00)
GFR, Estimated: >60 mL/min (ref >60)
Glucose, Bld: 157 mg/dL — ABNORMAL HIGH (ref 70–99)
Potassium: 3.9 mmol/L (ref 3.5–5.1)
Sodium: 138 mmol/L (ref 135–145)

## 2021-12-02 LAB — HEMOGLOBIN A1C
Hgb A1c MFr Bld: 6.3 % — ABNORMAL HIGH (ref 4.8–5.6)
Mean Plasma Glucose: 134 mg/dL

## 2021-12-02 LAB — CK: Total CK: 781 U/L — ABNORMAL HIGH (ref 38–234)

## 2021-12-02 MED ORDER — DOXYCYCLINE MONOHYDRATE 100 MG PO CAPS: 100.0000 mg | ORAL_CAPSULE | Freq: Two times a day (BID) | ORAL | 0 refills | Status: AC

## 2021-12-02 MED ORDER — OXYCODONE-ACETAMINOPHEN 5-325 MG PO TABS: 1.0000 | ORAL_TABLET | Freq: Four times a day (QID) | ORAL | 0 refills | Status: AC | PRN

## 2021-12-02 MED ORDER — LOVASTATIN 40 MG PO TABS: 40.0000 mg | ORAL_TABLET | Freq: Every day | ORAL | Status: AC

## 2021-12-02 MED ORDER — GABAPENTIN 50 MG PO TABS: 300.0000 mg | ORAL_TABLET | Freq: Three times a day (TID) | ORAL | 0 refills | Status: AC

## 2021-12-02 NOTE — Progress Notes (Signed)
   12/02/21 1245  Vitals  Temp 97.7 F (36.5 C)  Temp Source Oral  BP 104/69  MAP (mmHg) 81  BP Location Left Arm  BP Method Automatic  Patient Position (if appropriate) Sitting  Pulse Rate (!) 109  Pulse Rate Source Monitor  Resp 17  Level of Consciousness  Level of Consciousness Alert  MEWS COLOR  MEWS Score Color Green  Oxygen Therapy  SpO2 100 %  O2 Device Room Air  Pain Assessment  Pain Scale 0-10  Pain Score 0   Pt discharged to home, family member is picking her up. VSS. Denies pain at this time. Discharge instructions discussed with patients. All questions/concerns addressed. All needs met, no further needs at this time. Pt in room waiting for her ride home.

## 2021-12-02 NOTE — Discharge Summary (Addendum)
Physician Discharge Summary  Alyssa Summers OZH:[id number] DOB: 1950/10/29 DOA: 11/29/2021  PCP: Kirk Ruths, MD  Admit date: 11/29/2021 Discharge date: 12/02/2021  Admitted From: home Disposition:  home  Recommendations for Outpatient Follow-up:  Follow up with PCP in 1 week  Home Health: no Equipment/Devices:  Discharge Condition: stable  CODE STATUS: full  Diet recommendation: Heart Healthy / Carb Modified   Brief/Interim Summary: HPI was taken from Dr. Blaine Hamper: Alyssa Summers is a 71 y.o. female with medical history significant of hypertension, hyperlipidemia, diabetes mellitus, TIA, tachycardia, breast cancer (s/p right lumpectomy, on chemotherapy every 3 week at the [facility]), tobacco abuse, who presents with muscle pain.   Patient states that she started having muscle pain, initially started at right forearm, which is constant, sharp, 10 out of 10 in severity, nonradiating.  The right arm is also swelling, then progressed to have pain in left forearm and left leg, but no swelling in left arm or left leg.  Patient denies any injury.  Denies fever or chills.  No chest pain, cough, shortness breath.  No nausea, vomiting, diarrhea or abdominal pain.  No symptoms of UTI.     Patient was seen in [facility] clinic on 7/11 and had a negative right upper extremity venous Doppler for DVT. Pt also had CK lab which was 329 on 7/11 --> 391 on 7/13. Pt was instructed to have stopped taking lovastatin 3 days ago per patient. Her last dose of chemotherapy was 1 week ago.   Data reviewed independently and ED Course: pt was found to have CK 552 30, troponin level 6, lactic acid 1.9, WBC 20.5, GFR> 60, magnesium 2.0, temperature 99, blood pressure 120/78, heart rate 103, 88, RR 18, oxygen saturation 100% on room air.  Chest x-ray negative.  Patient is placed on MedSurg bed for observation.     EKG: I have personally reviewed.  Sinus rhythm, QTc 487, bifascicular block, early R wave progression, low  voltage.   As per Dr. Jimmye Norman 7/16-7/17/23: Pt was found to have rhabdomyolysis likely secondary to statin. Statin was held while in the hospital and should be held until pt sees her PCP. Of note, pt was found to have cellulitis of the right forearm and pt was put on IV ancef and d/c home on po doxycycline to complete the course. Pt's MRI findings were discussed w/ ortho surg (Dr. Sabra Heck) and nothing more to do but continue abxs. Pt was ambulating and transferring independently so therapy was not indicated. Finally, pt was started on gabapentin for likely peripheral neuropathy likely from recent chemo. The dosage may need to be adjusted or changed to another medication by PCP and/or onco.   Discharge Diagnoses:  Principal Problem:   Rhabdomyolysis Active Problems:   SIRS (systemic inflammatory response syndrome) ([place])   Diabetes mellitus without complication ([place])   Hypertension   TIA (transient ischemic attack)   Tobacco abuse   Breast cancer ([place])   Cellulitis of right forearm  Rhabdomyolysis: maybe secondary to statin use. CK level is labile. Continue on IVFs. Neg RUE [place] for DVT  Right forearm cellulitis: MRI right forearm showed no well-define or rim-enhancing collection at this time. No evidence of acute osteomyelitis of the radius or ulna. Continue on IV ancef inpatient and switched to po doxycycline at d/c. MRSA screen was neg   Sepsis: met criteria w/ leukocytosis, tachycardia & secondary right forearm cellulitis. Sepsis resolved    DM2: HbA1c 6.2, well controlled. Continue on SSI w/ accuchecks. Holding metformin  HTN: continue on amlodipine. IV hydralazine prn   Hx of TIA: continue to hold statin    Tobacco abuse: smoking cessation counseling x 5 mins. Nicotine patch to prevent w/drawl    Breast cancer : s/p right lumpectomy. Last dose of chemo 1 week ago at [facility]. F/u w/ [facility] onco   Possible peripheral neuropathy: likely secondary to recent chemo. Continue on gabapentin  (started this admission)   Discharge Instructions  Discharge Instructions     Diet - low sodium heart healthy   Complete by: As directed    Diet Carb Modified   Complete by: As directed    Discharge instructions   Complete by: As directed    F/u w/ PCP in 1 week   Increase activity slowly   Complete by: As directed       Allergies as of 12/02/2021       Reactions   Hydrochlorothiazide Anaphylaxis   Peanut Allergen Powder-dnfp Anaphylaxis   Ace Inhibitors Swelling   Egg White (egg Protein) Itching   Glipizide    Soy Allergy         Medication List     STOP taking these medications    gabapentin 300 MG capsule Commonly known as: NEURONTIN Replaced by: Gabapentin 50 MG Tabs   Jardiance 25 MG Tabs tablet Generic drug: empagliflozin       TAKE these medications    amLODipine 5 MG tablet Commonly known as: NORVASC Take 5 mg by mouth daily.   doxycycline 100 MG capsule Commonly known as: MONODOX Take 1 capsule (100 mg total) by mouth 2 (two) times daily for 7 days.   Gabapentin 50 MG Tabs Take 300 mg by mouth 3 (three) times daily. Replaces: gabapentin 300 MG capsule   Januvia 50 MG tablet Generic drug: sitaGLIPtin Take 50 mg by mouth daily.   lovastatin 40 MG tablet Commonly known as: MEVACOR Take 1 tablet (40 mg total) by mouth daily after supper. Hold this medication until you see your PCP What changed: additional instructions   metFORMIN 500 MG 24 hr tablet Commonly known as: GLUCOPHAGE-XR Take 500 mg by mouth 2 (two) times daily.   naphazoline-pheniramine 0.025-0.3 % ophthalmic solution Commonly known as: NAPHCON-A Place 1 drop into both eyes 4 (four) times daily as needed for eye irritation.   oxyCODONE-acetaminophen 5-325 MG tablet Commonly known as: PERCOCET/ROXICET Take 1 tablet by mouth every 6 (six) hours as needed for up to 5 days for moderate pain or severe pain.   vitamin B-12 1000 MCG tablet Commonly known as:  CYANOCOBALAMIN Take 1 tablet (1,000 mcg total) by mouth daily.        Allergies  Allergen Reactions   Hydrochlorothiazide Anaphylaxis   Peanut Allergen Powder-Dnfp Anaphylaxis   Ace Inhibitors Swelling   Egg White (Egg Protein) Itching   Glipizide    Soy Allergy     Consultations:    Procedures/Studies: MR FOREARM RIGHT W WO CONTRAST  Result Date: 11/30/2021 CLINICAL DATA:  Soft tissue infection suspected, forearm, no prior imaging EXAM: MRI OF THE RIGHT FOREARM WITHOUT AND WITH CONTRAST TECHNIQUE: Multiplanar, multisequence MR imaging of the right forearm was performed before and after the administration of intravenous contrast. CONTRAST:  30m GADAVIST GADOBUTROL 1 MMOL/ML IV SOLN COMPARISON:  None Available. FINDINGS: Bones/Joint/Cartilage No acute fracture. No dislocation. No bony erosion or marrow replacement. No bone marrow edema. No radiocarpal joint effusion. Elbow joint is only partially imaged at the edge of the field of view. Ligaments Intact. Muscles and  Tendons Extensive intramuscular edema throughout the flexor compartment of the right forearm and to a lesser degree within the extensor compartment musculature of the forearm. There is prominent perifascial and edema tracking throughout the flexor compartment with perifascial enhancement. Mild perifascial edema within the extensor compartment. No well-defined intramuscular fluid collection. Soft tissues Circumferential subcutaneous edema and fluid throughout the forearm without a well defined or rim enhancing component at this time. IMPRESSION: 1. Extensive findings of acute myofasciitis throughout the right forearm, more pronounced within the flexor compartment although also involving the extensor compartment. No well-defined intramuscular fluid collection. Please note that necrotizing fasciitis is a clinical diagnosis and can not be diagnosed by imaging alone. 2. Cellulitis with circumferential subcutaneous edema and fluid  throughout the forearm without a well-defined or rim-enhancing collection at this time. 3. No evidence of acute osteomyelitis of the radius or ulna. No findings of septic arthritis of the radiocarpal joint. Elbow joint is not adequately assessed at the edge of the field of view. Electronically Signed   By: Davina Poke D.O.   On: 11/30/2021 09:41   DG Chest 2 View  Result Date: 11/29/2021 CLINICAL DATA:  Myalgias, elevated white count, immunocompromised. EXAM: CHEST - 2 VIEW COMPARISON:  January 28, 2019 FINDINGS: The heart size and mediastinal contours are within normal limits. Aortic atherosclerosis. No focal airspace consolidation. No pleural effusion. No pneumothorax. No acute osseous abnormality. Surgical clips project over the right axilla and breast. IMPRESSION: No acute cardiopulmonary disease. Electronically Signed   By: Dahlia Bailiff M.D.   On: 11/29/2021 10:32   US Venous Img Upper Uni Right(DVT)  Result Date: 11/28/2021 CLINICAL DATA:  Forearm pain and swelling EXAM: RIGHT UPPER EXTREMITY VENOUS DOPPLER ULTRASOUND TECHNIQUE: Gray-scale sonography with graded compression, as well as color Doppler and duplex ultrasound were performed to evaluate the upper extremity deep venous system from the level of the subclavian vein and including the jugular, axillary, basilic, radial, ulnar and upper cephalic vein. Spectral Doppler was utilized to evaluate flow at rest and with distal augmentation maneuvers. COMPARISON:  None Available. FINDINGS: Contralateral Subclavian Vein: Respiratory phasicity is normal and symmetric with the symptomatic side. No evidence of thrombus. Normal compressibility. Internal Jugular Vein: No evidence of thrombus. Normal compressibility, respiratory phasicity and response to augmentation. Subclavian Vein: No evidence of thrombus. Normal compressibility, respiratory phasicity and response to augmentation. Axillary Vein: No evidence of thrombus. Normal compressibility,  respiratory phasicity and response to augmentation. Cephalic Vein: No evidence of thrombus. Normal compressibility, respiratory phasicity and response to augmentation. Basilic Vein: No evidence of thrombus. Normal compressibility, respiratory phasicity and response to augmentation. Brachial Veins: No evidence of thrombus. Normal compressibility, respiratory phasicity and response to augmentation. Radial Veins: No evidence of thrombus. Normal compressibility, respiratory phasicity and response to augmentation. Ulnar Veins: No evidence of thrombus. Normal compressibility, respiratory phasicity and response to augmentation. Venous Reflux:  None visualized. Other Findings:  None visualized. IMPRESSION: Negative examination for deep venous thrombosis in the right upper extremity. Electronically Signed   By: Delanna Ahmadi M.D.   On: 11/28/2021 10:58   (Echo, Carotid, EGD, Colonoscopy, ERCP)    Subjective: Pt c/o arm pain & swelling, slight improvement from day prior   Discharge Exam: Vitals:   12/02/21 0356 12/02/21 0739  BP: 122/83 108/82  Pulse: (!) 107 (!) 109  Resp: 15 16  Temp: 98.1 F (36.7 C) 98.4 F (36.9 C)  SpO2: 97% 97%   Vitals:   12/01/21 0722 12/01/21 1936 12/02/21 0356 12/02/21 2355  BP: 112/75 120/74 122/83 108/82  Pulse: (!) 106 (!) 109 (!) 107 (!) 109  Resp: 17 17 15 16   Temp: 98.3 F (36.8 C) 98.3 F (36.8 C) 98.1 F (36.7 C) 98.4 F (36.9 C)  TempSrc: Oral   Oral  SpO2: 95% 99% 97% 97%  Weight:      Height:        General: Pt is alert, awake, not in acute distress Cardiovascular: S1/S2 +, no rubs, no gallops Respiratory: CTA bilaterally, no wheezing, no rhonchi Abdominal: Soft, NT, ND, bowel sounds + Extremities: RUE has edema, no cyanosis    The results of significant diagnostics from this hospitalization (including imaging, microbiology, ancillary and laboratory) are listed below for reference.     Microbiology: Recent Results (from the past 240 hour(s))   Culture, blood (routine x 2)     Status: None (Preliminary result)   Collection Time: 11/29/21  9:29 AM   Specimen: BLOOD  Result Value Ref Range Status   Specimen Description BLOOD LEFT AC  Final   Special Requests   Final    BOTTLES DRAWN AEROBIC AND ANAEROBIC Blood Culture results may not be optimal due to an inadequate volume of blood received in culture bottles   Culture   Final    NO GROWTH 3 DAYS Performed at [facility], [address], [place], [place] [address]    Report Status PENDING  Incomplete  Culture, blood (routine x 2)     Status: None (Preliminary result)   Collection Time: 11/29/21  9:30 AM   Specimen: BLOOD  Result Value Ref Range Status   Specimen Description BLOOD LEFT HAND  Final   Special Requests   Final    BOTTLES DRAWN AEROBIC AND ANAEROBIC Blood Culture results may not be optimal due to an inadequate volume of blood received in culture bottles   Culture   Final    NO GROWTH 3 DAYS Performed at [facility], [address] [place], [place] [address]    Report Status PENDING  Incomplete  MRSA Next Gen by PCR, Nasal     Status: None   Collection Time: 12/01/21  8:14 AM   Specimen: Nasal Mucosa; Nasal Swab  Result Value Ref Range Status   MRSA by PCR Next Gen NOT DETECTED NOT DETECTED Final    Comment: (NOTE) The GeneXpert MRSA Assay (FDA approved for NASAL specimens only), is one component of a comprehensive MRSA colonization surveillance program. It is not intended to diagnose MRSA infection nor to guide or monitor treatment for MRSA infections. Test performance is not FDA approved in patients less than 66 years old. Performed at [facility], [address] [place], [place] [address]      Labs: BNP (last 3 results) No results for input(s): "BNP" in the last 8760 hours. Basic Metabolic Panel: Recent Labs  Lab 11/29/21 0824 11/30/21 0631 12/01/21 0520 12/02/21 0519  NA 136 134* 138 138  K 4.2 3.9 3.9 3.9   CL 108 109 112* 112*  CO2 21* 19* 21* 22  GLUCOSE 246* 179* 155* 157*  BUN 12 10 8  7*  CREATININE 0.63 0.60 0.63 0.50  CALCIUM 9.3 8.0* 8.3* 8.3*  MG 2.0  --   --   --    Liver Function Tests: Recent Labs  Lab 11/29/21 0824  AST 23  ALT 17  ALKPHOS 77  BILITOT 0.9  PROT 6.4*  ALBUMIN 3.7   No results for input(s): "LIPASE", "AMYLASE" in the last  168 hours. No results for input(s): "AMMONIA" in the last 168 hours. CBC: Recent Labs  Lab 11/29/21 0824 11/30/21 0631 12/01/21 0520 12/02/21 0519  WBC 20.5* 13.5* 12.1* 12.7*  NEUTROABS 17.6*  --   --   --   HGB 13.1 11.9* 11.8* 12.1  HCT 39.5 35.3* 35.8* 37.0  MCV 90.0 89.8 91.1 90.7  PLT 225 231 250 282   Cardiac Enzymes: Recent Labs  Lab 11/29/21 0824 11/30/21 0631 12/01/21 0520 12/02/21 0519  CKTOTAL 552* 405* 674* 781*   BNP: Invalid input(s): "POCBNP" CBG: Recent Labs  Lab 12/01/21 1143 12/01/21 1803 12/01/21 2101 12/02/21 0743 12/02/21 1123  GLUCAP 181* 293* 143* 171* 139*   D-Dimer No results for input(s): "DDIMER" in the last 72 hours. Hgb A1c No results for input(s): "HGBA1C" in the last 72 hours. Lipid Profile No results for input(s): "CHOL", "HDL", "LDLCALC", "TRIG", "CHOLHDL", "LDLDIRECT" in the last 72 hours. Thyroid function studies No results for input(s): "TSH", "T4TOTAL", "T3FREE", "THYROIDAB" in the last 72 hours.  Invalid input(s): "FREET3" Anemia work up No results for input(s): "VITAMINB12", "FOLATE", "FERRITIN", "TIBC", "IRON", "RETICCTPCT" in the last 72 hours. Urinalysis    Component Value Date/Time   COLORURINE YELLOW (A) 11/29/2021 1225   APPEARANCEUR CLEAR (A) 11/29/2021 1225   APPEARANCEUR Clear 12/29/2013 1419   LABSPEC 1.030 11/29/2021 1225   LABSPEC 1.025 12/29/2013 1419   PHURINE 5.0 11/29/2021 1225   GLUCOSEU >=500 (A) 11/29/2021 1225   GLUCOSEU 150 mg/dL 12/29/2013 1419   HGBUR NEGATIVE 11/29/2021 1225   BILIRUBINUR NEGATIVE 11/29/2021 1225   BILIRUBINUR  Negative 12/29/2013 1419   KETONESUR 80 (A) 11/29/2021 1225   PROTEINUR NEGATIVE 11/29/2021 1225   NITRITE NEGATIVE 11/29/2021 1225   LEUKOCYTESUR NEGATIVE 11/29/2021 1225   LEUKOCYTESUR Negative 12/29/2013 1419   Sepsis Labs Recent Labs  Lab 11/29/21 0824 11/30/21 0631 12/01/21 0520 12/02/21 0519  WBC 20.5* 13.5* 12.1* 12.7*   Microbiology Recent Results (from the past 240 hour(s))  Culture, blood (routine x 2)     Status: None (Preliminary result)   Collection Time: 11/29/21  9:29 AM   Specimen: BLOOD  Result Value Ref Range Status   Specimen Description BLOOD LEFT AC  Final   Special Requests   Final    BOTTLES DRAWN AEROBIC AND ANAEROBIC Blood Culture results may not be optimal due to an inadequate volume of blood received in culture bottles   Culture   Final    NO GROWTH 3 DAYS Performed at [facility], [address], [place], [place] [address]    Report Status PENDING  Incomplete  Culture, blood (routine x 2)     Status: None (Preliminary result)   Collection Time: 11/29/21  9:30 AM   Specimen: BLOOD  Result Value Ref Range Status   Specimen Description BLOOD LEFT HAND  Final   Special Requests   Final    BOTTLES DRAWN AEROBIC AND ANAEROBIC Blood Culture results may not be optimal due to an inadequate volume of blood received in culture bottles   Culture   Final    NO GROWTH 3 DAYS Performed at [facility], [address] [place], [place] [address]    Report Status PENDING  Incomplete  MRSA Next Gen by PCR, Nasal     Status: None   Collection Time: 12/01/21  8:14 AM   Specimen: Nasal Mucosa; Nasal Swab  Result Value Ref Range Status   MRSA by PCR Next Gen NOT DETECTED NOT DETECTED Final  Comment: (NOTE) The GeneXpert MRSA Assay (FDA approved for NASAL specimens only), is one component of a comprehensive MRSA colonization surveillance program. It is not intended to diagnose MRSA infection nor to guide or monitor treatment for MRSA  infections. Test performance is not FDA approved in patients less than 59 years old. Performed at [facility], [address] [place], [place] [address]      Time coordinating discharge: Over 30 minutes  SIGNED:   Wyvonnia Dusky, MD  [facility] 12/02/2021, 12:26 PM Pager   If 7PM-7AM, please contact night-coverage [email or web address]

## 2021-12-03 ENCOUNTER — Institutional Professional Consult (permissible substitution): Payer: 59 | Admitting: Radiation Oncology

## 2021-12-04 LAB — CULTURE, BLOOD (ROUTINE X 2)
Culture: NO GROWTH
Culture: NO GROWTH

## 2021-12-09 ENCOUNTER — Ambulatory Visit: Payer: 59 | Admitting: Radiation Oncology

## 2021-12-18 ENCOUNTER — Encounter: Payer: Self-pay | Admitting: Radiation Oncology

## 2021-12-18 ENCOUNTER — Ambulatory Visit
Admission: RE | Admit: 2021-12-18 | Discharge: 2021-12-18 | Disposition: A | Discharge status: Home / Self Care | Payer: 59 | Source: Ambulatory Visit | Attending: Radiation Oncology | Admitting: Radiation Oncology

## 2021-12-18 VITALS — BP 116/76 | HR 120 | Temp 98.2°F | Resp 16 | Ht 68.0 in | Wt 163.0 lb

## 2021-12-18 DIAGNOSIS — C50411 Malignant neoplasm of upper-outer quadrant of right female breast: Secondary | ICD-10-CM

## 2021-12-18 NOTE — Consult Note (Signed)
NEW PATIENT EVALUATION  Name: Alyssa Summers  MRN: [id number]  Date:   12/18/2021     DOB: 1951-01-08   This 71 y.o. 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   PREVIOUS INVESTIGATIONS:  Mammograms and ultrasound reviewed Pathology reports reviewed Clinical notes reviewed  HPI: Patient is a 71 year old female who has been followed for right breast calcifications since 2021.  She was followed closely with serial mammograms.  January 23 mammogram showed development of asymmetry in the 10 o'clock position with suggestion of potential architectural distortion.  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  Tumor was ER/PR positive strongly HER2/neu 2+ FISH nonamplified.  She underwent aWide local excision for a 4.9 cm invasive mammary carcinoma with lymphovascular invasion present..  Margins were clear with closest margin being for DCIS at 0.2 mm.  3 sentinel regional lymph nodes were sampled 1 had positive micrometastatic disease at 0.5 mm.  Oncotype DX showed a RS of 23 with close to 20% recurrence risk at 9 years.  She is undergone Doxy Taxol and cyclophosphamide since May 2023 and she has just completed that.  In early July she was admitted to [facility] for rhabdomyolysis secondary to statin use.  That is resolving slowly.  She is seen today for consideration of radiation therapy.  She specifically denies breast tenderness cough or bone pain.  She is quite fatigued.  PLANNED TREATMENT REGIMEN: Right breast and peripheral lymphatic radiation  PAST MEDICAL  HISTORY:  has a past medical history of Breast cancer ([place]), Diabetes mellitus without complication ([place]), Hypertension, and Tachycardia.    PAST SURGICAL HISTORY:  Past Surgical History:  Procedure Laterality Date   lap choleycystecomy      FAMILY HISTORY: family history includes Cancer in her father; Hypertension in her mother.  SOCIAL HISTORY:  reports that she has been smoking cigarettes. She has been smoking an average of .5 packs per day. She has never used smokeless tobacco. She reports current alcohol use. She reports that she does not use drugs.  ALLERGIES: Hydrochlorothiazide, Peanut allergen powder-dnfp, Ace inhibitors, Egg white (egg protein), Glipizide, and Soy allergy  MEDICATIONS:  Current Outpatient Medications  Medication Sig Dispense Refill   amLODipine (NORVASC) 5 MG tablet Take 5 mg by mouth daily.     JANUVIA 50 MG tablet Take 50 mg by mouth daily.     lovastatin (MEVACOR) 40 MG tablet Take 1 tablet (40 mg total) by mouth daily after supper. Hold this medication until you see your PCP     metFORMIN (GLUCOPHAGE-XR) 500 MG 24 hr tablet Take 500 mg by mouth 2 (two) times daily.     naphazoline-pheniramine (NAPHCON-A) 0.025-0.3 % ophthalmic solution Place 1 drop into both eyes 4 (four) times daily as needed for eye irritation. 15 mL 1   gabapentin 50 MG TABS Take 300 mg by mouth 3 (three) times daily. (Patient not taking: Reported on 12/18/2021) 90 tablet 0   vitamin B-12 (CYANOCOBALAMIN) 1000 MCG tablet Take 1 tablet (1,000 mcg total) by mouth daily. (Patient not taking: Reported on 11/29/2021) 50 tablet 0   No current facility-administered  medications for this encounter.    ECOG PERFORMANCE STATUS:  1 - Symptomatic but completely ambulatory  REVIEW OF SYSTEMS: Patient has a history of hypertension type 2 diabetes hyperlipidemia Patient denies any weight loss, fatigue, weakness, fever, chills or night sweats. Patient denies any loss of vision, blurred vision. Patient  denies any ringing  of the ears or hearing loss. No irregular heartbeat. Patient denies heart murmur or history of fainting. Patient denies any chest pain or pain radiating to her upper extremities. Patient denies any shortness of breath, difficulty breathing at night, cough or hemoptysis. Patient denies any swelling in the lower legs. Patient denies any nausea vomiting, vomiting of blood, or coffee ground material in the vomitus. Patient denies any stomach pain. Patient states has had normal bowel movements no significant constipation or diarrhea. Patient denies any dysuria, hematuria or significant nocturia. Patient denies any problems walking, swelling in the joints or loss of balance. Patient denies any skin changes, loss of hair or loss of weight. Patient denies any excessive worrying or anxiety or significant depression. Patient denies any problems with insomnia. Patient denies excessive thirst, polyuria, polydipsia. Patient denies any swollen glands, patient denies easy bruising or easy bleeding. Patient denies any recent infections, allergies or URI. Patient "s visual fields have not changed significantly in recent time.   PHYSICAL EXAM: BP 116/76 (BP Location: Left Arm, Patient Position: Sitting, Cuff Size: Normal)   Pulse (!) 120   Temp 98.2 F (36.8 C)   Resp 16   Ht _0  (1.727 m)   Wt 163 lb (73.9 kg)   BMI 24.78 kg/m  She is status post wide local excision of the right breast.  That incision is healed well no dominant masses noted in either breast no axillary or supraclavicular adenopathy is appreciated.  Well-developed well-nourished patient in NAD. HEENT reveals PERLA, EOMI, discs not visualized.  Oral cavity is clear. No oral mucosal lesions are identified. Neck is clear without evidence of cervical or supraclavicular adenopathy. Lungs are clear to A&P. Cardiac examination is essentially unremarkable with regular rate and rhythm without murmur rub or thrill. Abdomen is benign with no  organomegaly or masses noted. Motor sensory and DTR levels are equal and symmetric in the upper and lower extremities. Cranial nerves II through XII are grossly intact. Proprioception is intact. No peripheral adenopathy or edema is identified. No motor or sensory levels are noted. Crude visual fields are within normal range.  LABORATORY DATA: Pathology report reviewed    RADIOLOGY RESULTS: Mammograms ultrasound reviewed compatible with above-stated findings   IMPRESSION: Stage IIb invasive mammary carcinoma of the right breast status post wide local excision and adjuvant chemotherapy in 71 year old female  PLAN: At this time based on her poor prognostic factors including tumor size close to 5 cm extensive lymph-vascular invasion 1 of 3 lymph nodes positive for micrometastatic disease I would offer radiation therapy to her right breast and peripheral lymphatics.  I would treat both those areas to 5040 cGy in 28 fractions.  Would also boost her scar another 1000 cGy using electron beam.  Risks and benefits of treatment including skin reaction fatigue alteration of blood counts possible inclusion of superficial lung slight chance of lymphedema of the right upper extremity all were reviewed in detail with the patient.  She comprehends my treatment plan well.  I have personally set up and ordered CT simulation.  Patient will benefit from endocrine therapy after completion of radiation.  I would like to take this opportunity to  thank you for allowing me to participate in the care of your patient.Noreene Filbert, MD

## 2021-12-30 ENCOUNTER — Ambulatory Visit
Admission: RE | Admit: 2021-12-30 | Discharge: 2021-12-30 | Disposition: A | Discharge status: Home / Self Care | Payer: 59 | Source: Ambulatory Visit | Attending: Radiation Oncology | Admitting: Radiation Oncology

## 2021-12-30 DIAGNOSIS — C773 Secondary and unspecified malignant neoplasm of axilla and upper limb lymph nodes: Secondary | ICD-10-CM | POA: Insufficient documentation

## 2021-12-30 DIAGNOSIS — C50411 Malignant neoplasm of upper-outer quadrant of right female breast: Secondary | ICD-10-CM | POA: Insufficient documentation

## 2021-12-30 DIAGNOSIS — Z170 Estrogen receptor positive status [ER+]: Secondary | ICD-10-CM | POA: Insufficient documentation

## 2021-12-30 DIAGNOSIS — Z510 Encounter for antineoplastic radiation therapy: Secondary | ICD-10-CM | POA: Insufficient documentation

## 2022-01-01 DIAGNOSIS — Z510 Encounter for antineoplastic radiation therapy: Secondary | ICD-10-CM | POA: Diagnosis not present

## 2022-01-03 ENCOUNTER — Other Ambulatory Visit: Payer: Self-pay | Admitting: *Deleted

## 2022-01-03 DIAGNOSIS — Z170 Estrogen receptor positive status [ER+]: Secondary | ICD-10-CM

## 2022-01-06 ENCOUNTER — Ambulatory Visit: Admission: RE | Admit: 2022-01-06 | Payer: 59 | Source: Ambulatory Visit

## 2022-01-06 DIAGNOSIS — Z510 Encounter for antineoplastic radiation therapy: Secondary | ICD-10-CM | POA: Diagnosis not present

## 2022-01-07 ENCOUNTER — Ambulatory Visit
Admission: RE | Admit: 2022-01-07 | Discharge: 2022-01-07 | Disposition: A | Discharge status: Home / Self Care | Payer: 59 | Source: Ambulatory Visit | Attending: Radiation Oncology | Admitting: Radiation Oncology

## 2022-01-07 ENCOUNTER — Other Ambulatory Visit: Payer: Self-pay

## 2022-01-07 DIAGNOSIS — Z510 Encounter for antineoplastic radiation therapy: Secondary | ICD-10-CM | POA: Diagnosis not present

## 2022-01-07 LAB — RAD ONC ARIA SESSION SUMMARY
Course Elapsed Days: 0
Plan Fractions Treated to Date: 1
Plan Prescribed Dose Per Fraction: 1.8 Gy
Plan Total Fractions Prescribed: 28
Plan Total Prescribed Dose: 50.4 Gy
Reference Point Dosage Given to Date: 1.8 Gy
Reference Point Session Dosage Given: 1.8 Gy
Session Number: 1

## 2022-01-08 ENCOUNTER — Ambulatory Visit
Admission: RE | Admit: 2022-01-08 | Discharge: 2022-01-08 | Disposition: A | Discharge status: Home / Self Care | Payer: 59 | Source: Ambulatory Visit | Attending: Radiation Oncology | Admitting: Radiation Oncology

## 2022-01-08 ENCOUNTER — Other Ambulatory Visit: Payer: Self-pay

## 2022-01-08 ENCOUNTER — Inpatient Hospital Stay: Payer: 59 | Attending: Oncology

## 2022-01-08 DIAGNOSIS — Z510 Encounter for antineoplastic radiation therapy: Secondary | ICD-10-CM | POA: Diagnosis not present

## 2022-01-08 DIAGNOSIS — Z170 Estrogen receptor positive status [ER+]: Secondary | ICD-10-CM

## 2022-01-08 LAB — CBC
HCT: 38.7 % (ref 36.0–46.0)
Hemoglobin: 13.1 g/dL (ref 12.0–15.0)
MCH: 29.8 pg (ref 26.0–34.0)
MCHC: 33.9 g/dL (ref 30.0–36.0)
MCV: 88 fL (ref 80.0–100.0)
Platelets: 271 10*3/uL (ref 150–400)
RBC: 4.4 MIL/uL (ref 3.87–5.11)
RDW: 15.4 % (ref 11.5–15.5)
WBC: 15.4 10*3/uL — ABNORMAL HIGH (ref 4.0–10.5)
nRBC: 0 % (ref 0.0–0.2)

## 2022-01-08 LAB — RAD ONC ARIA SESSION SUMMARY
Course Elapsed Days: 1
Plan Fractions Treated to Date: 2
Plan Prescribed Dose Per Fraction: 1.8 Gy
Plan Total Fractions Prescribed: 28
Plan Total Prescribed Dose: 50.4 Gy
Reference Point Dosage Given to Date: 3.6 Gy
Reference Point Session Dosage Given: 1.8 Gy
Session Number: 2

## 2022-01-09 ENCOUNTER — Ambulatory Visit
Admission: RE | Admit: 2022-01-09 | Discharge: 2022-01-09 | Disposition: A | Discharge status: Home / Self Care | Payer: 59 | Source: Ambulatory Visit | Attending: Radiation Oncology | Admitting: Radiation Oncology

## 2022-01-09 ENCOUNTER — Other Ambulatory Visit: Payer: Self-pay

## 2022-01-09 DIAGNOSIS — Z510 Encounter for antineoplastic radiation therapy: Secondary | ICD-10-CM | POA: Diagnosis not present

## 2022-01-09 LAB — RAD ONC ARIA SESSION SUMMARY
Course Elapsed Days: 2
Plan Fractions Treated to Date: 3
Plan Prescribed Dose Per Fraction: 1.8 Gy
Plan Total Fractions Prescribed: 28
Plan Total Prescribed Dose: 50.4 Gy
Reference Point Dosage Given to Date: 5.4 Gy
Reference Point Session Dosage Given: 1.8 Gy
Session Number: 3

## 2022-01-10 ENCOUNTER — Ambulatory Visit
Admission: RE | Admit: 2022-01-10 | Discharge: 2022-01-10 | Disposition: A | Discharge status: Home / Self Care | Payer: 59 | Source: Ambulatory Visit | Attending: Radiation Oncology | Admitting: Radiation Oncology

## 2022-01-10 ENCOUNTER — Other Ambulatory Visit: Payer: Self-pay

## 2022-01-10 DIAGNOSIS — Z510 Encounter for antineoplastic radiation therapy: Secondary | ICD-10-CM | POA: Diagnosis not present

## 2022-01-10 LAB — RAD ONC ARIA SESSION SUMMARY
Course Elapsed Days: 3
Plan Fractions Treated to Date: 4
Plan Prescribed Dose Per Fraction: 1.8 Gy
Plan Total Fractions Prescribed: 28
Plan Total Prescribed Dose: 50.4 Gy
Reference Point Dosage Given to Date: 7.2 Gy
Reference Point Session Dosage Given: 1.8 Gy
Session Number: 4

## 2022-01-13 ENCOUNTER — Other Ambulatory Visit: Payer: Self-pay

## 2022-01-13 ENCOUNTER — Ambulatory Visit
Admission: RE | Admit: 2022-01-13 | Discharge: 2022-01-13 | Disposition: A | Discharge status: Home / Self Care | Payer: 59 | Source: Ambulatory Visit | Attending: Radiation Oncology | Admitting: Radiation Oncology

## 2022-01-13 DIAGNOSIS — Z510 Encounter for antineoplastic radiation therapy: Secondary | ICD-10-CM | POA: Diagnosis not present

## 2022-01-13 LAB — RAD ONC ARIA SESSION SUMMARY
Course Elapsed Days: 6
Plan Fractions Treated to Date: 5
Plan Prescribed Dose Per Fraction: 1.8 Gy
Plan Total Fractions Prescribed: 28
Plan Total Prescribed Dose: 50.4 Gy
Reference Point Dosage Given to Date: 9 Gy
Reference Point Session Dosage Given: 1.8 Gy
Session Number: 5

## 2022-01-14 ENCOUNTER — Ambulatory Visit
Admission: RE | Admit: 2022-01-14 | Discharge: 2022-01-14 | Disposition: A | Discharge status: Home / Self Care | Payer: 59 | Source: Ambulatory Visit | Attending: Radiation Oncology | Admitting: Radiation Oncology

## 2022-01-14 ENCOUNTER — Other Ambulatory Visit: Payer: Self-pay

## 2022-01-14 DIAGNOSIS — Z510 Encounter for antineoplastic radiation therapy: Secondary | ICD-10-CM | POA: Diagnosis not present

## 2022-01-14 LAB — RAD ONC ARIA SESSION SUMMARY
Course Elapsed Days: 7
Plan Fractions Treated to Date: 6
Plan Prescribed Dose Per Fraction: 1.8 Gy
Plan Total Fractions Prescribed: 28
Plan Total Prescribed Dose: 50.4 Gy
Reference Point Dosage Given to Date: 10.8 Gy
Reference Point Session Dosage Given: 1.8 Gy
Session Number: 6

## 2022-01-15 ENCOUNTER — Ambulatory Visit
Admission: RE | Admit: 2022-01-15 | Discharge: 2022-01-15 | Disposition: A | Discharge status: Home / Self Care | Payer: 59 | Source: Ambulatory Visit | Attending: Radiation Oncology | Admitting: Radiation Oncology

## 2022-01-15 ENCOUNTER — Other Ambulatory Visit: Payer: Self-pay

## 2022-01-15 DIAGNOSIS — Z510 Encounter for antineoplastic radiation therapy: Secondary | ICD-10-CM | POA: Diagnosis not present

## 2022-01-15 LAB — RAD ONC ARIA SESSION SUMMARY
Course Elapsed Days: 8
Plan Fractions Treated to Date: 7
Plan Prescribed Dose Per Fraction: 1.8 Gy
Plan Total Fractions Prescribed: 28
Plan Total Prescribed Dose: 50.4 Gy
Reference Point Dosage Given to Date: 12.6 Gy
Reference Point Session Dosage Given: 1.8 Gy
Session Number: 7

## 2022-01-16 ENCOUNTER — Other Ambulatory Visit: Payer: Self-pay

## 2022-01-16 ENCOUNTER — Ambulatory Visit
Admission: RE | Admit: 2022-01-16 | Discharge: 2022-01-16 | Disposition: A | Discharge status: Home / Self Care | Payer: 59 | Source: Ambulatory Visit | Attending: Radiation Oncology | Admitting: Radiation Oncology

## 2022-01-16 DIAGNOSIS — Z510 Encounter for antineoplastic radiation therapy: Secondary | ICD-10-CM | POA: Diagnosis not present

## 2022-01-16 LAB — RAD ONC ARIA SESSION SUMMARY
Course Elapsed Days: 9
Plan Fractions Treated to Date: 8
Plan Prescribed Dose Per Fraction: 1.8 Gy
Plan Total Fractions Prescribed: 28
Plan Total Prescribed Dose: 50.4 Gy
Reference Point Dosage Given to Date: 14.4 Gy
Reference Point Session Dosage Given: 1.8 Gy
Session Number: 8

## 2022-01-17 ENCOUNTER — Ambulatory Visit
Admission: RE | Admit: 2022-01-17 | Discharge: 2022-01-17 | Disposition: A | Discharge status: Home / Self Care | Payer: 59 | Source: Ambulatory Visit | Attending: Radiation Oncology | Admitting: Radiation Oncology

## 2022-01-17 ENCOUNTER — Other Ambulatory Visit: Payer: Self-pay

## 2022-01-17 DIAGNOSIS — C50411 Malignant neoplasm of upper-outer quadrant of right female breast: Secondary | ICD-10-CM | POA: Diagnosis not present

## 2022-01-17 DIAGNOSIS — C779 Secondary and unspecified malignant neoplasm of lymph node, unspecified: Secondary | ICD-10-CM | POA: Diagnosis not present

## 2022-01-17 DIAGNOSIS — Z510 Encounter for antineoplastic radiation therapy: Secondary | ICD-10-CM | POA: Diagnosis not present

## 2022-01-17 DIAGNOSIS — Z170 Estrogen receptor positive status [ER+]: Secondary | ICD-10-CM | POA: Insufficient documentation

## 2022-01-17 LAB — RAD ONC ARIA SESSION SUMMARY
Course Elapsed Days: 10
Plan Fractions Treated to Date: 9
Plan Prescribed Dose Per Fraction: 1.8 Gy
Plan Total Fractions Prescribed: 28
Plan Total Prescribed Dose: 50.4 Gy
Reference Point Dosage Given to Date: 16.2 Gy
Reference Point Session Dosage Given: 1.8 Gy
Session Number: 9

## 2022-01-21 ENCOUNTER — Other Ambulatory Visit: Payer: Self-pay

## 2022-01-21 ENCOUNTER — Ambulatory Visit
Admission: RE | Admit: 2022-01-21 | Discharge: 2022-01-21 | Disposition: A | Discharge status: Home / Self Care | Payer: 59 | Source: Ambulatory Visit | Attending: Radiation Oncology | Admitting: Radiation Oncology

## 2022-01-21 DIAGNOSIS — Z510 Encounter for antineoplastic radiation therapy: Secondary | ICD-10-CM | POA: Diagnosis not present

## 2022-01-21 LAB — RAD ONC ARIA SESSION SUMMARY
Course Elapsed Days: 14
Plan Fractions Treated to Date: 10
Plan Prescribed Dose Per Fraction: 1.8 Gy
Plan Total Fractions Prescribed: 28
Plan Total Prescribed Dose: 50.4 Gy
Reference Point Dosage Given to Date: 18 Gy
Reference Point Session Dosage Given: 1.8 Gy
Session Number: 10

## 2022-01-22 ENCOUNTER — Ambulatory Visit
Admission: RE | Admit: 2022-01-22 | Discharge: 2022-01-22 | Disposition: A | Discharge status: Home / Self Care | Payer: 59 | Source: Ambulatory Visit | Attending: Radiation Oncology | Admitting: Radiation Oncology

## 2022-01-22 ENCOUNTER — Other Ambulatory Visit: Payer: Self-pay

## 2022-01-22 ENCOUNTER — Inpatient Hospital Stay: Payer: 59

## 2022-01-22 DIAGNOSIS — Z170 Estrogen receptor positive status [ER+]: Secondary | ICD-10-CM | POA: Insufficient documentation

## 2022-01-22 DIAGNOSIS — C50411 Malignant neoplasm of upper-outer quadrant of right female breast: Secondary | ICD-10-CM | POA: Insufficient documentation

## 2022-01-22 DIAGNOSIS — Z510 Encounter for antineoplastic radiation therapy: Secondary | ICD-10-CM | POA: Diagnosis not present

## 2022-01-22 LAB — RAD ONC ARIA SESSION SUMMARY
Course Elapsed Days: 15
Plan Fractions Treated to Date: 11
Plan Prescribed Dose Per Fraction: 1.8 Gy
Plan Total Fractions Prescribed: 28
Plan Total Prescribed Dose: 50.4 Gy
Reference Point Dosage Given to Date: 19.8 Gy
Reference Point Session Dosage Given: 1.8 Gy
Session Number: 11

## 2022-01-23 ENCOUNTER — Other Ambulatory Visit: Payer: Self-pay

## 2022-01-23 ENCOUNTER — Ambulatory Visit
Admission: RE | Admit: 2022-01-23 | Discharge: 2022-01-23 | Disposition: A | Discharge status: Home / Self Care | Payer: 59 | Source: Ambulatory Visit | Attending: Radiation Oncology | Admitting: Radiation Oncology

## 2022-01-23 DIAGNOSIS — Z510 Encounter for antineoplastic radiation therapy: Secondary | ICD-10-CM | POA: Diagnosis not present

## 2022-01-23 LAB — RAD ONC ARIA SESSION SUMMARY
Course Elapsed Days: 16
Plan Fractions Treated to Date: 12
Plan Prescribed Dose Per Fraction: 1.8 Gy
Plan Total Fractions Prescribed: 28
Plan Total Prescribed Dose: 50.4 Gy
Reference Point Dosage Given to Date: 21.6 Gy
Reference Point Session Dosage Given: 1.8 Gy
Session Number: 12

## 2022-01-24 ENCOUNTER — Other Ambulatory Visit: Payer: Self-pay

## 2022-01-24 ENCOUNTER — Ambulatory Visit
Admission: RE | Admit: 2022-01-24 | Discharge: 2022-01-24 | Disposition: A | Discharge status: Home / Self Care | Payer: 59 | Source: Ambulatory Visit | Attending: Radiation Oncology | Admitting: Radiation Oncology

## 2022-01-24 DIAGNOSIS — Z510 Encounter for antineoplastic radiation therapy: Secondary | ICD-10-CM | POA: Diagnosis not present

## 2022-01-24 LAB — RAD ONC ARIA SESSION SUMMARY
Course Elapsed Days: 17
Plan Fractions Treated to Date: 13
Plan Prescribed Dose Per Fraction: 1.8 Gy
Plan Total Fractions Prescribed: 28
Plan Total Prescribed Dose: 50.4 Gy
Reference Point Dosage Given to Date: 23.4 Gy
Reference Point Session Dosage Given: 1.8 Gy
Session Number: 13

## 2022-01-27 ENCOUNTER — Ambulatory Visit
Admission: RE | Admit: 2022-01-27 | Discharge: 2022-01-27 | Disposition: A | Discharge status: Home / Self Care | Payer: 59 | Source: Ambulatory Visit | Attending: Radiation Oncology | Admitting: Radiation Oncology

## 2022-01-27 ENCOUNTER — Other Ambulatory Visit: Payer: Self-pay

## 2022-01-27 DIAGNOSIS — Z510 Encounter for antineoplastic radiation therapy: Secondary | ICD-10-CM | POA: Diagnosis not present

## 2022-01-27 LAB — RAD ONC ARIA SESSION SUMMARY
Course Elapsed Days: 20
Plan Fractions Treated to Date: 14
Plan Prescribed Dose Per Fraction: 1.8 Gy
Plan Total Fractions Prescribed: 28
Plan Total Prescribed Dose: 50.4 Gy
Reference Point Dosage Given to Date: 25.2 Gy
Reference Point Session Dosage Given: 1.8 Gy
Session Number: 14

## 2022-01-28 ENCOUNTER — Ambulatory Visit
Admission: RE | Admit: 2022-01-28 | Discharge: 2022-01-28 | Disposition: A | Discharge status: Home / Self Care | Payer: 59 | Source: Ambulatory Visit | Attending: Radiation Oncology | Admitting: Radiation Oncology

## 2022-01-28 ENCOUNTER — Other Ambulatory Visit: Payer: Self-pay

## 2022-01-28 DIAGNOSIS — Z510 Encounter for antineoplastic radiation therapy: Secondary | ICD-10-CM | POA: Diagnosis not present

## 2022-01-28 LAB — RAD ONC ARIA SESSION SUMMARY
Course Elapsed Days: 21
Plan Fractions Treated to Date: 15
Plan Prescribed Dose Per Fraction: 1.8 Gy
Plan Total Fractions Prescribed: 28
Plan Total Prescribed Dose: 50.4 Gy
Reference Point Dosage Given to Date: 27 Gy
Reference Point Session Dosage Given: 1.8 Gy
Session Number: 15

## 2022-01-29 ENCOUNTER — Other Ambulatory Visit: Payer: Self-pay

## 2022-01-29 ENCOUNTER — Ambulatory Visit
Admission: RE | Admit: 2022-01-29 | Discharge: 2022-01-29 | Disposition: A | Discharge status: Home / Self Care | Payer: 59 | Source: Ambulatory Visit | Attending: Radiation Oncology | Admitting: Radiation Oncology

## 2022-01-29 DIAGNOSIS — Z510 Encounter for antineoplastic radiation therapy: Secondary | ICD-10-CM | POA: Diagnosis not present

## 2022-01-29 LAB — RAD ONC ARIA SESSION SUMMARY
Course Elapsed Days: 22
Plan Fractions Treated to Date: 16
Plan Prescribed Dose Per Fraction: 1.8 Gy
Plan Total Fractions Prescribed: 28
Plan Total Prescribed Dose: 50.4 Gy
Reference Point Dosage Given to Date: 28.8 Gy
Reference Point Session Dosage Given: 1.8 Gy
Session Number: 16

## 2022-01-30 ENCOUNTER — Ambulatory Visit
Admission: RE | Admit: 2022-01-30 | Discharge: 2022-01-30 | Disposition: A | Discharge status: Home / Self Care | Payer: 59 | Source: Ambulatory Visit | Attending: Radiation Oncology | Admitting: Radiation Oncology

## 2022-01-30 ENCOUNTER — Other Ambulatory Visit: Payer: Self-pay

## 2022-01-30 DIAGNOSIS — Z510 Encounter for antineoplastic radiation therapy: Secondary | ICD-10-CM | POA: Diagnosis not present

## 2022-01-30 LAB — RAD ONC ARIA SESSION SUMMARY
Course Elapsed Days: 23
Plan Fractions Treated to Date: 17
Plan Prescribed Dose Per Fraction: 1.8 Gy
Plan Total Fractions Prescribed: 28
Plan Total Prescribed Dose: 50.4 Gy
Reference Point Dosage Given to Date: 30.6 Gy
Reference Point Session Dosage Given: 1.8 Gy
Session Number: 17

## 2022-01-31 ENCOUNTER — Ambulatory Visit
Admission: RE | Admit: 2022-01-31 | Discharge: 2022-01-31 | Disposition: A | Discharge status: Home / Self Care | Payer: 59 | Source: Ambulatory Visit | Attending: Radiation Oncology | Admitting: Radiation Oncology

## 2022-01-31 ENCOUNTER — Other Ambulatory Visit: Payer: Self-pay

## 2022-01-31 DIAGNOSIS — Z510 Encounter for antineoplastic radiation therapy: Secondary | ICD-10-CM | POA: Diagnosis not present

## 2022-01-31 LAB — RAD ONC ARIA SESSION SUMMARY
Course Elapsed Days: 24
Plan Fractions Treated to Date: 18
Plan Prescribed Dose Per Fraction: 1.8 Gy
Plan Total Fractions Prescribed: 28
Plan Total Prescribed Dose: 50.4 Gy
Reference Point Dosage Given to Date: 32.4 Gy
Reference Point Session Dosage Given: 1.8 Gy
Session Number: 18

## 2022-02-03 ENCOUNTER — Ambulatory Visit
Admission: RE | Admit: 2022-02-03 | Discharge: 2022-02-03 | Disposition: A | Discharge status: Home / Self Care | Payer: 59 | Source: Ambulatory Visit | Attending: Radiation Oncology | Admitting: Radiation Oncology

## 2022-02-03 ENCOUNTER — Other Ambulatory Visit: Payer: Self-pay

## 2022-02-03 DIAGNOSIS — Z510 Encounter for antineoplastic radiation therapy: Secondary | ICD-10-CM | POA: Diagnosis not present

## 2022-02-03 LAB — RAD ONC ARIA SESSION SUMMARY
Course Elapsed Days: 27
Plan Fractions Treated to Date: 19
Plan Prescribed Dose Per Fraction: 1.8 Gy
Plan Total Fractions Prescribed: 28
Plan Total Prescribed Dose: 50.4 Gy
Reference Point Dosage Given to Date: 34.2 Gy
Reference Point Session Dosage Given: 1.8 Gy
Session Number: 19

## 2022-02-04 ENCOUNTER — Ambulatory Visit
Admission: RE | Admit: 2022-02-04 | Discharge: 2022-02-04 | Disposition: A | Discharge status: Home / Self Care | Payer: 59 | Source: Ambulatory Visit | Attending: Radiation Oncology | Admitting: Radiation Oncology

## 2022-02-04 ENCOUNTER — Other Ambulatory Visit: Payer: Self-pay

## 2022-02-04 DIAGNOSIS — Z510 Encounter for antineoplastic radiation therapy: Secondary | ICD-10-CM | POA: Diagnosis not present

## 2022-02-04 LAB — RAD ONC ARIA SESSION SUMMARY
Course Elapsed Days: 28
Plan Fractions Treated to Date: 20
Plan Prescribed Dose Per Fraction: 1.8 Gy
Plan Total Fractions Prescribed: 28
Plan Total Prescribed Dose: 50.4 Gy
Reference Point Dosage Given to Date: 36 Gy
Reference Point Session Dosage Given: 1.8 Gy
Session Number: 20

## 2022-02-05 ENCOUNTER — Inpatient Hospital Stay: Payer: 59

## 2022-02-05 ENCOUNTER — Other Ambulatory Visit: Payer: Self-pay

## 2022-02-05 ENCOUNTER — Ambulatory Visit
Admission: RE | Admit: 2022-02-05 | Discharge: 2022-02-05 | Disposition: A | Discharge status: Home / Self Care | Payer: 59 | Source: Ambulatory Visit | Attending: Radiation Oncology | Admitting: Radiation Oncology

## 2022-02-05 DIAGNOSIS — Z510 Encounter for antineoplastic radiation therapy: Secondary | ICD-10-CM | POA: Diagnosis not present

## 2022-02-05 DIAGNOSIS — Z170 Estrogen receptor positive status [ER+]: Secondary | ICD-10-CM

## 2022-02-05 LAB — CBC
HCT: 45.3 % (ref 36.0–46.0)
Hemoglobin: 15.2 g/dL — ABNORMAL HIGH (ref 12.0–15.0)
MCH: 28.7 pg (ref 26.0–34.0)
MCHC: 33.6 g/dL (ref 30.0–36.0)
MCV: 85.5 fL (ref 80.0–100.0)
Platelets: 258 10*3/uL (ref 150–400)
RBC: 5.3 MIL/uL — ABNORMAL HIGH (ref 3.87–5.11)
RDW: 16.9 % — ABNORMAL HIGH (ref 11.5–15.5)
WBC: 6.1 10*3/uL (ref 4.0–10.5)
nRBC: 0 % (ref 0.0–0.2)

## 2022-02-05 LAB — RAD ONC ARIA SESSION SUMMARY
Course Elapsed Days: 29
Plan Fractions Treated to Date: 21
Plan Prescribed Dose Per Fraction: 1.8 Gy
Plan Total Fractions Prescribed: 28
Plan Total Prescribed Dose: 50.4 Gy
Reference Point Dosage Given to Date: 37.8 Gy
Reference Point Session Dosage Given: 1.8 Gy
Session Number: 21

## 2022-02-06 ENCOUNTER — Other Ambulatory Visit: Payer: Self-pay

## 2022-02-06 ENCOUNTER — Ambulatory Visit
Admission: RE | Admit: 2022-02-06 | Discharge: 2022-02-06 | Disposition: A | Discharge status: Home / Self Care | Payer: 59 | Source: Ambulatory Visit | Attending: Radiation Oncology | Admitting: Radiation Oncology

## 2022-02-06 DIAGNOSIS — Z510 Encounter for antineoplastic radiation therapy: Secondary | ICD-10-CM | POA: Diagnosis not present

## 2022-02-06 LAB — RAD ONC ARIA SESSION SUMMARY
Course Elapsed Days: 30
Plan Fractions Treated to Date: 22
Plan Prescribed Dose Per Fraction: 1.8 Gy
Plan Total Fractions Prescribed: 28
Plan Total Prescribed Dose: 50.4 Gy
Reference Point Dosage Given to Date: 39.6 Gy
Reference Point Session Dosage Given: 1.8 Gy
Session Number: 22

## 2022-02-07 ENCOUNTER — Ambulatory Visit
Admission: RE | Admit: 2022-02-07 | Discharge: 2022-02-07 | Disposition: A | Discharge status: Home / Self Care | Payer: 59 | Source: Ambulatory Visit | Attending: Radiation Oncology | Admitting: Radiation Oncology

## 2022-02-07 ENCOUNTER — Other Ambulatory Visit: Payer: Self-pay

## 2022-02-07 DIAGNOSIS — Z510 Encounter for antineoplastic radiation therapy: Secondary | ICD-10-CM | POA: Diagnosis not present

## 2022-02-07 LAB — RAD ONC ARIA SESSION SUMMARY
Course Elapsed Days: 31
Plan Fractions Treated to Date: 23
Plan Prescribed Dose Per Fraction: 1.8 Gy
Plan Total Fractions Prescribed: 28
Plan Total Prescribed Dose: 50.4 Gy
Reference Point Dosage Given to Date: 41.4 Gy
Reference Point Session Dosage Given: 1.8 Gy
Session Number: 23

## 2022-02-10 ENCOUNTER — Other Ambulatory Visit: Payer: Self-pay

## 2022-02-10 ENCOUNTER — Ambulatory Visit
Admission: RE | Admit: 2022-02-10 | Discharge: 2022-02-10 | Disposition: A | Discharge status: Home / Self Care | Payer: 59 | Source: Ambulatory Visit | Attending: Radiation Oncology | Admitting: Radiation Oncology

## 2022-02-10 DIAGNOSIS — Z510 Encounter for antineoplastic radiation therapy: Secondary | ICD-10-CM | POA: Diagnosis not present

## 2022-02-10 LAB — RAD ONC ARIA SESSION SUMMARY
Course Elapsed Days: 34
Plan Fractions Treated to Date: 24
Plan Prescribed Dose Per Fraction: 1.8 Gy
Plan Total Fractions Prescribed: 28
Plan Total Prescribed Dose: 50.4 Gy
Reference Point Dosage Given to Date: 43.2 Gy
Reference Point Session Dosage Given: 1.8 Gy
Session Number: 24

## 2022-02-11 ENCOUNTER — Ambulatory Visit
Admission: RE | Admit: 2022-02-11 | Discharge: 2022-02-11 | Disposition: A | Discharge status: Home / Self Care | Payer: 59 | Source: Ambulatory Visit | Attending: Radiation Oncology | Admitting: Radiation Oncology

## 2022-02-11 ENCOUNTER — Other Ambulatory Visit: Payer: Self-pay

## 2022-02-11 DIAGNOSIS — Z510 Encounter for antineoplastic radiation therapy: Secondary | ICD-10-CM | POA: Diagnosis not present

## 2022-02-11 LAB — RAD ONC ARIA SESSION SUMMARY
Course Elapsed Days: 35
Plan Fractions Treated to Date: 25
Plan Prescribed Dose Per Fraction: 1.8 Gy
Plan Total Fractions Prescribed: 28
Plan Total Prescribed Dose: 50.4 Gy
Reference Point Dosage Given to Date: 45 Gy
Reference Point Session Dosage Given: 1.8 Gy
Session Number: 25

## 2022-02-12 ENCOUNTER — Other Ambulatory Visit: Payer: Self-pay

## 2022-02-12 ENCOUNTER — Ambulatory Visit
Admission: RE | Admit: 2022-02-12 | Discharge: 2022-02-12 | Disposition: A | Discharge status: Home / Self Care | Payer: 59 | Source: Ambulatory Visit | Attending: Radiation Oncology | Admitting: Radiation Oncology

## 2022-02-12 DIAGNOSIS — Z510 Encounter for antineoplastic radiation therapy: Secondary | ICD-10-CM | POA: Diagnosis not present

## 2022-02-12 LAB — RAD ONC ARIA SESSION SUMMARY
Course Elapsed Days: 36
Plan Fractions Treated to Date: 26
Plan Prescribed Dose Per Fraction: 1.8 Gy
Plan Total Fractions Prescribed: 28
Plan Total Prescribed Dose: 50.4 Gy
Reference Point Dosage Given to Date: 46.8 Gy
Reference Point Session Dosage Given: 1.8 Gy
Session Number: 26

## 2022-02-13 ENCOUNTER — Other Ambulatory Visit: Payer: Self-pay

## 2022-02-13 ENCOUNTER — Ambulatory Visit
Admission: RE | Admit: 2022-02-13 | Discharge: 2022-02-13 | Disposition: A | Discharge status: Home / Self Care | Payer: 59 | Source: Ambulatory Visit | Attending: Radiation Oncology | Admitting: Radiation Oncology

## 2022-02-13 DIAGNOSIS — Z510 Encounter for antineoplastic radiation therapy: Secondary | ICD-10-CM | POA: Diagnosis not present

## 2022-02-13 LAB — RAD ONC ARIA SESSION SUMMARY
Course Elapsed Days: 37
Plan Fractions Treated to Date: 27
Plan Prescribed Dose Per Fraction: 1.8 Gy
Plan Total Fractions Prescribed: 28
Plan Total Prescribed Dose: 50.4 Gy
Reference Point Dosage Given to Date: 48.6 Gy
Reference Point Session Dosage Given: 1.8 Gy
Session Number: 27

## 2022-02-14 ENCOUNTER — Ambulatory Visit
Admission: RE | Admit: 2022-02-14 | Discharge: 2022-02-14 | Disposition: A | Discharge status: Home / Self Care | Payer: 59 | Source: Ambulatory Visit | Attending: Radiation Oncology | Admitting: Radiation Oncology

## 2022-02-14 ENCOUNTER — Other Ambulatory Visit: Payer: Self-pay

## 2022-02-14 DIAGNOSIS — Z510 Encounter for antineoplastic radiation therapy: Secondary | ICD-10-CM | POA: Diagnosis not present

## 2022-02-14 LAB — RAD ONC ARIA SESSION SUMMARY
Course Elapsed Days: 38
Plan Fractions Treated to Date: 28
Plan Prescribed Dose Per Fraction: 1.8 Gy
Plan Total Fractions Prescribed: 28
Plan Total Prescribed Dose: 50.4 Gy
Reference Point Dosage Given to Date: 50.4 Gy
Reference Point Session Dosage Given: 1.8 Gy
Session Number: 28

## 2022-02-17 ENCOUNTER — Other Ambulatory Visit: Payer: Self-pay

## 2022-02-17 ENCOUNTER — Ambulatory Visit
Admission: RE | Admit: 2022-02-17 | Discharge: 2022-02-17 | Disposition: A | Discharge status: Home / Self Care | Payer: 59 | Source: Ambulatory Visit | Attending: Radiation Oncology | Admitting: Radiation Oncology

## 2022-02-17 DIAGNOSIS — Z510 Encounter for antineoplastic radiation therapy: Secondary | ICD-10-CM | POA: Diagnosis present

## 2022-02-17 DIAGNOSIS — C779 Secondary and unspecified malignant neoplasm of lymph node, unspecified: Secondary | ICD-10-CM | POA: Insufficient documentation

## 2022-02-17 DIAGNOSIS — C50411 Malignant neoplasm of upper-outer quadrant of right female breast: Secondary | ICD-10-CM | POA: Insufficient documentation

## 2022-02-17 DIAGNOSIS — Z170 Estrogen receptor positive status [ER+]: Secondary | ICD-10-CM | POA: Diagnosis not present

## 2022-02-17 LAB — RAD ONC ARIA SESSION SUMMARY
Course Elapsed Days: 41
Plan Fractions Treated to Date: 1
Plan Prescribed Dose Per Fraction: 2 Gy
Plan Total Fractions Prescribed: 5
Plan Total Prescribed Dose: 10 Gy
Reference Point Dosage Given to Date: 2 Gy
Reference Point Session Dosage Given: 2 Gy
Session Number: 29

## 2022-02-18 ENCOUNTER — Ambulatory Visit
Admission: RE | Admit: 2022-02-18 | Discharge: 2022-02-18 | Disposition: A | Discharge status: Home / Self Care | Payer: 59 | Source: Ambulatory Visit | Attending: Radiation Oncology | Admitting: Radiation Oncology

## 2022-02-18 ENCOUNTER — Other Ambulatory Visit: Payer: Self-pay

## 2022-02-18 DIAGNOSIS — Z510 Encounter for antineoplastic radiation therapy: Secondary | ICD-10-CM | POA: Diagnosis not present

## 2022-02-18 LAB — RAD ONC ARIA SESSION SUMMARY
Course Elapsed Days: 42
Plan Fractions Treated to Date: 2
Plan Prescribed Dose Per Fraction: 2 Gy
Plan Total Fractions Prescribed: 5
Plan Total Prescribed Dose: 10 Gy
Reference Point Dosage Given to Date: 4 Gy
Reference Point Session Dosage Given: 2 Gy
Session Number: 30

## 2022-02-19 ENCOUNTER — Ambulatory Visit
Admission: RE | Admit: 2022-02-19 | Discharge: 2022-02-19 | Disposition: A | Discharge status: Home / Self Care | Payer: 59 | Source: Ambulatory Visit | Attending: Radiation Oncology | Admitting: Radiation Oncology

## 2022-02-19 ENCOUNTER — Other Ambulatory Visit: Payer: Self-pay

## 2022-02-19 ENCOUNTER — Inpatient Hospital Stay: Payer: 59 | Attending: Oncology

## 2022-02-19 DIAGNOSIS — Z510 Encounter for antineoplastic radiation therapy: Secondary | ICD-10-CM | POA: Diagnosis not present

## 2022-02-19 LAB — RAD ONC ARIA SESSION SUMMARY
Course Elapsed Days: 43
Plan Fractions Treated to Date: 3
Plan Prescribed Dose Per Fraction: 2 Gy
Plan Total Fractions Prescribed: 5
Plan Total Prescribed Dose: 10 Gy
Reference Point Dosage Given to Date: 6 Gy
Reference Point Session Dosage Given: 2 Gy
Session Number: 31

## 2022-02-20 ENCOUNTER — Other Ambulatory Visit: Payer: Self-pay

## 2022-02-20 ENCOUNTER — Ambulatory Visit
Admission: RE | Admit: 2022-02-20 | Discharge: 2022-02-20 | Disposition: A | Discharge status: Home / Self Care | Payer: 59 | Source: Ambulatory Visit | Attending: Radiation Oncology | Admitting: Radiation Oncology

## 2022-02-20 DIAGNOSIS — Z510 Encounter for antineoplastic radiation therapy: Secondary | ICD-10-CM | POA: Diagnosis not present

## 2022-02-20 LAB — RAD ONC ARIA SESSION SUMMARY
Course Elapsed Days: 44
Plan Fractions Treated to Date: 4
Plan Prescribed Dose Per Fraction: 2 Gy
Plan Total Fractions Prescribed: 5
Plan Total Prescribed Dose: 10 Gy
Reference Point Dosage Given to Date: 8 Gy
Reference Point Session Dosage Given: 2 Gy
Session Number: 32

## 2022-02-21 ENCOUNTER — Ambulatory Visit
Admission: RE | Admit: 2022-02-21 | Discharge: 2022-02-21 | Disposition: A | Discharge status: Home / Self Care | Payer: 59 | Source: Ambulatory Visit | Attending: Radiation Oncology | Admitting: Radiation Oncology

## 2022-02-21 ENCOUNTER — Other Ambulatory Visit: Payer: Self-pay

## 2022-02-21 DIAGNOSIS — Z510 Encounter for antineoplastic radiation therapy: Secondary | ICD-10-CM | POA: Diagnosis not present

## 2022-02-21 LAB — RAD ONC ARIA SESSION SUMMARY
Course Elapsed Days: 45
Plan Fractions Treated to Date: 5
Plan Prescribed Dose Per Fraction: 2 Gy
Plan Total Fractions Prescribed: 5
Plan Total Prescribed Dose: 10 Gy
Reference Point Dosage Given to Date: 10 Gy
Reference Point Session Dosage Given: 2 Gy
Session Number: 33

## 2022-03-20 ENCOUNTER — Encounter: Payer: Self-pay | Admitting: Radiation Oncology

## 2022-03-20 ENCOUNTER — Ambulatory Visit
Admission: RE | Admit: 2022-03-20 | Discharge: 2022-03-20 | Disposition: A | Discharge status: Home / Self Care | Payer: 59 | Source: Ambulatory Visit | Attending: Radiation Oncology | Admitting: Radiation Oncology

## 2022-03-20 VITALS — BP 134/87 | HR 107 | Temp 98.0°F | Resp 20 | Ht 68.0 in | Wt 158.0 lb

## 2022-03-20 DIAGNOSIS — C50411 Malignant neoplasm of upper-outer quadrant of right female breast: Secondary | ICD-10-CM | POA: Diagnosis present

## 2022-03-20 DIAGNOSIS — Z923 Personal history of irradiation: Secondary | ICD-10-CM | POA: Insufficient documentation

## 2022-03-20 DIAGNOSIS — Z170 Estrogen receptor positive status [ER+]: Secondary | ICD-10-CM | POA: Diagnosis not present

## 2022-03-20 NOTE — Progress Notes (Signed)
Radiation Oncology Follow up Note  Name: Alyssa Summers   Date:   03/20/2022 MRN:  [id number] DOB: 1950-06-13    This 71 y.o. female presents to the clinic today for 1 month follow-up status post whole breast radiation for stage IIb (T2N1 MI M0) ER/PR positive invasive mammary carcinoma the right breast status post wide local excision.  REFERRING PROVIDER: Kirk Ruths, MD  HPI: Patient is a 71 year old female now at 1 month having completed whole breast radiation to her right breast and peripheral lymphatics after wide local excision adjuvant chemotherapy for a 5 cm invasive mammary carcinoma with extensive lymphovascular invasion and 1 of 3 lymph nodes positive for micrometastatic disease.  Seen today in routine follow-up she is doing well.  She specifically denies breast tenderness cough or bone pain..  She still has some hyperpigmentation and dry desquamation of the skin noticeable.  She was just started on letrozole seems to be tolerating that well without side effects.  COMPLICATIONS OF TREATMENT: none  FOLLOW UP COMPLIANCE: keeps appointments   PHYSICAL EXAM:  BP 134/87 (BP Location: Left Arm, Patient Position: Sitting, Cuff Size: Normal)   Pulse (!) 107   Temp 98 F (36.7 C) (Tympanic)   Resp 20   Ht '5\' 8"'$  (1.727 m)   Wt 158 lb (71.7 kg)   BMI 24.02 kg/m  Lungs are clear to A&P cardiac examination essentially unremarkable with regular rate and rhythm. No dominant mass or nodularity is noted in either breast in 2 positions examined. Incision is well-healed. No axillary or supraclavicular adenopathy is appreciated. Cosmetic result is excellent.  Well-developed well-nourished patient in NAD. HEENT reveals PERLA, EOMI, discs not visualized.  Oral cavity is clear. No oral mucosal lesions are identified. Neck is clear without evidence of cervical or supraclavicular adenopathy. Lungs are clear to A&P. Cardiac examination is essentially unremarkable with regular rate and rhythm  without murmur rub or thrill. Abdomen is benign with no organomegaly or masses noted. Motor sensory and DTR levels are equal and symmetric in the upper and lower extremities. Cranial nerves II through XII are grossly intact. Proprioception is intact. No peripheral adenopathy or edema is identified. No motor or sensory levels are noted. Crude visual fields are within normal range.  RADIOLOGY RESULTS: No current films for review  PLAN: Present time patient is doing well 1 month out from whole breast radiation.  I am pleased with her overall progress.  I have asked to see her back in 5 to 6 months for follow-up.  I have given her some samples of moisturizer cream to use instead of Aquaphor and have assured her this will heal over time she continues close follow-up care with medical oncology.  Patient is to call with any concerns.  I would like to take this opportunity to thank you for allowing me to participate in the care of your patient.Noreene Filbert, MD

## 2022-07-30 ENCOUNTER — Ambulatory Visit (INDEPENDENT_AMBULATORY_CARE_PROVIDER_SITE_OTHER): Payer: 59 | Admitting: Dermatology

## 2022-07-30 ENCOUNTER — Encounter: Payer: Self-pay | Admitting: Dermatology

## 2022-07-30 VITALS — BP 126/82 | HR 107

## 2022-07-30 DIAGNOSIS — R21 Rash and other nonspecific skin eruption: Secondary | ICD-10-CM

## 2022-07-30 DIAGNOSIS — L308 Other specified dermatitis: Secondary | ICD-10-CM

## 2022-07-30 DIAGNOSIS — L299 Pruritus, unspecified: Secondary | ICD-10-CM | POA: Diagnosis not present

## 2022-07-30 MED ORDER — TRIAMCINOLONE ACETONIDE 0.1 % EX CREA: TOPICAL_CREAM | CUTANEOUS | 0 refills | Status: AC

## 2022-07-30 NOTE — Patient Instructions (Addendum)
Start over the counter Cetrizine - take 1 tab by mouth daily    Start triamcinolone 0.1 % cream - apply to affected areas of body twice daily for itchy rash as needed. Avoid applying to face, groin, and axilla. Use as directed. Long-term use can cause thinning of the skin.  Topical steroids (such as triamcinolone, fluocinolone, fluocinonide, mometasone, clobetasol, halobetasol, betamethasone, hydrocortisone) can cause thinning and lightening of the skin if they are used for too long in the same area. Your physician has selected the right strength medicine for your problem and area affected on the body. Please use your medication only as directed by your physician to prevent side effects.      Biopsy Wound Care Instructions  Leave the original bandage on for 24 hours if possible.  If the bandage becomes soaked or soiled before that time, it is OK to remove it and examine the wound.  A small amount of post-operative bleeding is normal.  If excessive bleeding occurs, remove the bandage, place gauze over the site and apply continuous pressure (no peeking) over the area for 30 minutes. If this does not work, please call our clinic as soon as possible or page your doctor if it is after hours.   Once a day, cleanse the wound with soap and water. It is fine to shower. If a thick crust develops you may use a Q-tip dipped into dilute hydrogen peroxide (mix 1:1 with water) to dissolve it.  Hydrogen peroxide can slow the healing process, so use it only as needed.    After washing, apply petroleum jelly (Vaseline) or an antibiotic ointment if your doctor prescribed one for you, followed by a bandage.    For best healing, the wound should be covered with a layer of ointment at all times. If you are not able to keep the area covered with a bandage to hold the ointment in place, this may mean re-applying the ointment several times a day.  Continue this wound care until the wound has healed and is no longer open.    Itching and mild discomfort is normal during the healing process. However, if you develop pain or severe itching, please call our office.   If you have any discomfort, you can take Tylenol (acetaminophen) or ibuprofen as directed on the bottle. (Please do not take these if you have an allergy to them or cannot take them for another reason).  Some redness, tenderness and white or yellow material in the wound is normal healing.  If the area becomes very sore and red, or develops a thick yellow-green material (pus), it may be infected; please notify us.    If you have stitches, return to clinic as directed to have the stitches removed. You will continue wound care for 2-3 days after the stitches are removed.   Wound healing continues for up to one year following surgery. It is not unusual to experience pain in the scar from time to time during the interval.  If the pain becomes severe or the scar thickens, you should notify the office.    A slight amount of redness in a scar is expected for the first six months.  After six months, the redness will fade and the scar will soften and fade.  The color difference becomes less noticeable with time.  If there are any problems, return for a post-op surgery check at your earliest convenience.  To improve the appearance of the scar, you can use silicone scar gel, cream,  or sheets (such as Mederma or Serica) every night for up to one year. These are available over the counter (without a prescription).  Please call our office at [phone] for any questions or concerns.    Due to recent changes in healthcare laws, you may see results of your pathology and/or laboratory studies on MyChart before the doctors have had a chance to review them. We understand that in some cases there may be results that are confusing or concerning to you. Please understand that not all results are received at the same time and often the doctors may need to interpret multiple  results in order to provide you with the best plan of care or course of treatment. Therefore, we ask that you please give [place] 2 business days to thoroughly review all your results before contacting the office for clarification. Should we see a critical lab result, you will be contacted sooner.   If You Need Anything After Your Visit  If you have any questions or concerns for your doctor, please call our main line at [phone] and press option 4 to reach your doctor's medical assistant. If no one answers, please leave a voicemail as directed and we will return your call as soon as possible. Messages left after 4 pm will be answered the following business day.   You may also send [place] a message via [place]. We typically respond to MyChart messages within 1-2 business days.  For prescription refills, please ask your pharmacy to contact our office. Our fax number is [phone].  If you have an urgent issue when the clinic is closed that cannot wait until the next business day, you can page your doctor at the number below.    Please note that while we do our best to be available for urgent issues outside of office hours, we are not available 24/7.   If you have an urgent issue and are unable to reach [place], you may choose to seek medical care at your doctor's office, retail clinic, urgent care center, or emergency room.  If you have a medical emergency, please immediately call 911 or go to the emergency department.  Pager Numbers  - Dr. Nehemiah Massed: [phone]  - Dr. Laurence Ferrari: [phone]  - Dr. Nicole Kindred: [phone]  In the event of inclement weather, please call our main line at [phone] for an update on the status of any delays or closures.  Dermatology Medication Tips: Please keep the boxes that topical medications come in in order to help keep track of the instructions about where and how to use these. Pharmacies typically print the medication instructions only on the boxes and not  directly on the medication tubes.   If your medication is too expensive, please contact our office at [phone] option 4 or send [place] a message through [place].   We are unable to tell what your co-pay for medications will be in advance as this is different depending on your insurance coverage. However, we may be able to find a substitute medication at lower cost or fill out paperwork to get insurance to cover a needed medication.   If a prior authorization is required to get your medication covered by your insurance company, please allow [place] 1-2 business days to complete this process.  Drug prices often vary depending on where the prescription is filled and some pharmacies may offer cheaper prices.  The website www.goodrx.com contains coupons for medications through different pharmacies. The prices here do not account for what the cost may  be with help from insurance (it may be cheaper with your insurance), but the website can give you the price if you did not use any insurance.  - You can print the associated coupon and take it with your prescription to the pharmacy.  - You may also stop by our office during regular business hours and pick up a GoodRx coupon card.  - If you need your prescription sent electronically to a different pharmacy, notify our office through [facility] or by phone at [phone] option 4.     Si Usted Necesita Algo Despus de Su Visita  Tambin puede enviarnos un mensaje a travs de Pharmacist, community. Por lo general respondemos a los mensajes de MyChart en el transcurso de 1 a 2 das hbiles.  Para renovar recetas, por favor pida a su farmacia que se ponga en contacto con nuestra oficina. Harland Dingwall de fax es [place] [phone].  Si tiene un asunto urgente cuando la clnica est cerrada y que no puede esperar hasta el siguiente da hbil, puede llamar/localizar a su doctor(a) al nmero que aparece a continuacin.   Por favor, tenga en cuenta que aunque hacemos  todo lo posible para estar disponibles para asuntos urgentes fuera del horario de [place], no estamos disponibles las 24 horas del da, los 7 das de la [place].   Si tiene un problema urgente y no puede comunicarse con nosotros, puede optar por buscar atencin mdica  en el consultorio de su doctor(a), en una clnica privada, en un centro de atencin urgente o en una sala de emergencias.  Si tiene Engineering geologist, por favor llame inmediatamente al 911 o vaya a la sala de emergencias.  Nmeros de bper  - Dr. Nehemiah Massed: [phone]  - Dra. Moye: [phone]  - Dra. Nicole Kindred: [phone]  En caso de inclemencias del [place], por favor llame a Johnsie Kindred principal al [phone] para una actualizacin sobre el [place] de cualquier retraso o cierre.  Consejos para la medicacin en dermatologa: Por favor, guarde las cajas en las que vienen los medicamentos de uso tpico para ayudarle a seguir las instrucciones sobre dnde y cmo usarlos. Las farmacias generalmente imprimen las instrucciones del medicamento slo en las cajas y no directamente en los tubos del [place].   Si su medicamento es muy caro, por favor, pngase en contacto con Zigmund Daniel llamando al [phone] y presione la opcin 4 o envenos un mensaje a travs de Pharmacist, community.   No podemos decirle cul ser su copago por los medicamentos por adelantado ya que esto es diferente dependiendo de la cobertura de su seguro. Sin embargo, es posible que podamos encontrar un medicamento sustituto a Electrical engineer un formulario para que el seguro cubra el medicamento que se considera necesario.   Si se requiere una autorizacin previa para que su compaa de seguros [place] su medicamento, por favor permtanos de 1 a 2 das hbiles para completar este proceso.  Los precios de los medicamentos varan con frecuencia dependiendo del Environmental consultant de dnde se surte la receta y alguna farmacias pueden ofrecer precios ms baratos.  El  sitio web www.goodrx.com tiene cupones para medicamentos de Airline pilot. Los precios aqu no tienen en cuenta lo que podra costar con la ayuda del seguro (puede ser ms barato con su seguro), pero el sitio web puede darle el precio si no utiliz Research scientist (physical sciences).  - Puede imprimir el cupn correspondiente y llevarlo con su receta a la farmacia.  - Tambin puede pasar por Zigmund Daniel  durante el horario de atencin regular y Charity fundraiser una tarjeta de cupones de GoodRx.  - Si necesita que su receta se enve electrnicamente a una farmacia diferente, informe a nuestra oficina a travs de MyChart de [place] o por telfono llamando al [phone] y presione la opcin 4.       Due to recent changes in healthcare laws, you may see results of your pathology and/or laboratory studies on MyChart before the doctors have had a chance to review them. We understand that in some cases there may be results that are confusing or concerning to you. Please understand that not all results are received at the same time and often the doctors may need to interpret multiple results in order to provide you with the best plan of care or course of treatment. Therefore, we ask that you please give [place] 2 business days to thoroughly review all your results before contacting the office for clarification. Should we see a critical lab result, you will be contacted sooner.   If You Need Anything After Your Visit  If you have any questions or concerns for your doctor, please call our main line at [phone] and press option 4 to reach your doctor's medical assistant. If no one answers, please leave a voicemail as directed and we will return your call as soon as possible. Messages left after 4 pm will be answered the following business day.   You may also send [place] a message via [place]. We typically respond to MyChart messages within 1-2 business days.  For prescription refills, please ask your pharmacy to contact our  office. Our fax number is [phone].  If you have an urgent issue when the clinic is closed that cannot wait until the next business day, you can page your doctor at the number below.    Please note that while we do our best to be available for urgent issues outside of office hours, we are not available 24/7.   If you have an urgent issue and are unable to reach [place], you may choose to seek medical care at your doctor's office, retail clinic, urgent care center, or emergency room.  If you have a medical emergency, please immediately call 911 or go to the emergency department.  Pager Numbers  - Dr. Nehemiah Massed: [phone]  - Dr. Laurence Ferrari: [phone]  - Dr. Nicole Kindred: [phone]  In the event of inclement weather, please call our main line at [phone] for an update on the status of any delays or closures.  Dermatology Medication Tips: Please keep the boxes that topical medications come in in order to help keep track of the instructions about where and how to use these. Pharmacies typically print the medication instructions only on the boxes and not directly on the medication tubes.   If your medication is too expensive, please contact our office at [phone] option 4 or send [place] a message through [place].   We are unable to tell what your co-pay for medications will be in advance as this is different depending on your insurance coverage. However, we may be able to find a substitute medication at lower cost or fill out paperwork to get insurance to cover a needed medication.   If a prior authorization is required to get your medication covered by your insurance company, please allow [place] 1-2 business days to complete this process.  Drug prices often vary depending on where the prescription is filled and some pharmacies may offer cheaper prices.  The website www.goodrx.com contains  coupons for medications through different pharmacies. The prices here do not account for what the cost may  be with help from insurance (it may be cheaper with your insurance), but the website can give you the price if you did not use any insurance.  - You can print the associated coupon and take it with your prescription to the pharmacy.  - You may also stop by our office during regular business hours and pick up a GoodRx coupon card.  - If you need your prescription sent electronically to a different pharmacy, notify our office through [facility] or by phone at [phone] option 4.     Si Usted Necesita Algo Despus de Su Visita  Tambin puede enviarnos un mensaje a travs de Pharmacist, community. Por lo general respondemos a los mensajes de MyChart en el transcurso de 1 a 2 das hbiles.  Para renovar recetas, por favor pida a su farmacia que se ponga en contacto con nuestra oficina. Harland Dingwall de fax es [place] [phone].  Si tiene un asunto urgente cuando la clnica est cerrada y que no puede esperar hasta el siguiente da hbil, puede llamar/localizar a su doctor(a) al nmero que aparece a continuacin.   Por favor, tenga en cuenta que aunque hacemos todo lo posible para estar disponibles para asuntos urgentes fuera del horario de [place], no estamos disponibles las 24 horas del da, los 7 das de la [place].   Si tiene un problema urgente y no puede comunicarse con nosotros, puede optar por buscar atencin mdica  en el consultorio de su doctor(a), en una clnica privada, en un centro de atencin urgente o en una sala de emergencias.  Si tiene Engineering geologist, por favor llame inmediatamente al 911 o vaya a la sala de emergencias.  Nmeros de bper  - Dr. Nehemiah Massed: [phone]  - Dra. Moye: [phone]  - Dra. Nicole Kindred: [phone]  En caso de inclemencias del [place], por favor llame a Johnsie Kindred principal al [phone] para una actualizacin sobre el [place] de cualquier retraso o cierre.  Consejos para la medicacin en dermatologa: Por favor, guarde las cajas en las  que vienen los medicamentos de uso tpico para ayudarle a seguir las instrucciones sobre dnde y cmo usarlos. Las farmacias generalmente imprimen las instrucciones del medicamento slo en las cajas y no directamente en los tubos del [place].   Si su medicamento es muy caro, por favor, pngase en contacto con Zigmund Daniel llamando al [phone] y presione la opcin 4 o envenos un mensaje a travs de Pharmacist, community.   No podemos decirle cul ser su copago por los medicamentos por adelantado ya que esto es diferente dependiendo de la cobertura de su seguro. Sin embargo, es posible que podamos encontrar un medicamento sustituto a Electrical engineer un formulario para que el seguro cubra el medicamento que se considera necesario.   Si se requiere una autorizacin previa para que su compaa de seguros [place] su medicamento, por favor permtanos de 1 a 2 das hbiles para completar este proceso.  Los precios de los medicamentos varan con frecuencia dependiendo del Environmental consultant de dnde se surte la receta y alguna farmacias pueden ofrecer precios ms baratos.  El sitio web www.goodrx.com tiene cupones para medicamentos de Airline pilot. Los precios aqu no tienen en cuenta lo que podra costar con la ayuda del seguro (puede ser ms barato con su seguro), pero el sitio web puede darle el precio si no utiliz Research scientist (physical sciences).  - Puede imprimir el cupn  correspondiente y llevarlo con su receta a la farmacia.  - Tambin puede pasar por nuestra oficina durante el horario de atencin regular y Charity fundraiser una tarjeta de cupones de GoodRx.  - Si necesita que su receta se enve electrnicamente a una farmacia diferente, informe a nuestra oficina a travs de MyChart de [place] o por telfono llamando al [phone] y presione la opcin 4.

## 2022-07-30 NOTE — Progress Notes (Signed)
Follow-Up Visit   Subjective  Alyssa Summers is a 72 y.o. female who presents for the following: Rash (Patient here today concerning rash that started in January all over body. She reports she has been very itchy with small red bumps. Seen by her pcp and prescribed prednisone along with hydroxazine tablets.  But nothing has helped. Patient states told to follow up with dermatologist. ).  Patient reports history of radiation from breast cancer. Prednisone helped.  The following portions of the chart were reviewed this encounter and updated as appropriate:  Tobacco  Allergies  Meds  Problems  Med Hx  Surg Hx  Fam Hx     Review of Systems: No other skin or systemic complaints except as noted in HPI or Assessment and Plan.  Objective  Well appearing patient in no apparent distress; mood and affect are within normal limits.  A focused examination was performed including back, b/l arms, b/l legs, chest. Relevant physical exam findings are noted in the Assessment and Plan.  right lateral elbow Red papule at right lateral elbow   Assessment & Plan  Rash and other nonspecific skin eruption And Pruritus right lateral elbow History of rash all over since January at trunk and extremities  Has taken prednisone, hydroxyzine 10 mg tab prn  Bx today at right lateral elbow R/o atopic dermatitis vs bite reaction vs other  Recommend  Starting otc claritin - take 1 tab po qd  Start tmc 0.1 cream - apply topically to any itchy areas on body twice daily until follow up appointment.  Avoid applying to face, groin, and axilla. Use as directed. Long-term use can cause thinning of the skin.  Topical steroids (such as triamcinolone, fluocinolone, fluocinonide, mometasone, clobetasol, halobetasol, betamethasone, hydrocortisone) can cause thinning and lightening of the skin if they are used for too long in the same area. Your physician has selected the right strength medicine for your problem and  area affected on the body. Please use your medication only as directed by your physician to prevent side effects.   Will re-evaluate in 1 week at suture removal follow up  Skin / nail biopsy - right lateral elbow Type of biopsy: punch   Informed consent: discussed and consent obtained   Timeout: patient name, date of birth, surgical site, and procedure verified   Procedure prep:  Patient was prepped and draped in usual sterile fashion (the patient was cleaned and prepped) Prep type:  Isopropyl alcohol Anesthesia: the lesion was anesthetized in a standard fashion   Anesthetic:  1% lidocaine w/ epinephrine 1-100,000 buffered w/ 8.4% NaHCO3 Punch size:  3 mm Suture size:  4-0 Suture type: nylon   Hemostasis achieved with: suture, pressure and aluminum chloride   Outcome: patient tolerated procedure well   Post-procedure details: sterile dressing applied and wound care instructions given   Dressing type: bandage, petrolatum and pressure dressing    triamcinolone cream (KENALOG) 0.1 % - right lateral elbow Apply topically to any itchy areas on body twice daily as needed for rash. Avoid applying to face, groin, and axilla. Use as directed. Long-term use can cause thinning of the skin.  Specimen 1 - Surgical pathology Differential Diagnosis: R/o atopic dermatitis vs bite reaction vs other Check Margins: No  Return for 1 week suture removal and rash follow up.  IRuthell Rummage, CMA, am acting as scribe for Sarina Ser, MD. Documentation: I have reviewed the above documentation for accuracy and completeness, and I agree with the above.  Shanon Brow  Nehemiah Massed, MD

## 2022-08-01 ENCOUNTER — Encounter: Payer: Self-pay | Admitting: Dermatology

## 2022-08-06 ENCOUNTER — Ambulatory Visit: Payer: 59

## 2022-08-06 NOTE — Patient Instructions (Signed)
Due to recent changes in healthcare laws, you may see results of your pathology and/or laboratory studies on MyChart before the doctors have had a chance to review them. We understand that in some cases there may be results that are confusing or concerning to you. Please understand that not all results are received at the same time and often the doctors may need to interpret multiple results in order to provide you with the best plan of care or course of treatment. Therefore, we ask that you please give us 2 business days to thoroughly review all your results before contacting the office for clarification. Should we see a critical lab result, you will be contacted sooner.   If You Need Anything After Your Visit  If you have any questions or concerns for your doctor, please call our main line at [phone] and press option 4 to reach your doctor's medical assistant. If no one answers, please leave a voicemail as directed and we will return your call as soon as possible. Messages left after 4 pm will be answered the following business day.   You may also send us a message via MyChart. We typically respond to MyChart messages within 1-2 business days.  For prescription refills, please ask your pharmacy to contact our office. Our fax number is [phone].  If you have an urgent issue when the clinic is closed that cannot wait until the next business day, you can page your doctor at the number below.    Please note that while we do our best to be available for urgent issues outside of office hours, we are not available 24/7.   If you have an urgent issue and are unable to reach us, you may choose to seek medical care at your doctor's office, retail clinic, urgent care center, or emergency room.  If you have a medical emergency, please immediately call 911 or go to the emergency department.  Pager Numbers  - Dr. Kowalski: [phone]  - Dr. Moye: [phone]  - Dr. Stewart:  [phone]  In the event of inclement weather, please call our main line at [phone] for an update on the status of any delays or closures.  Dermatology Medication Tips: Please keep the boxes that topical medications come in in order to help keep track of the instructions about where and how to use these. Pharmacies typically print the medication instructions only on the boxes and not directly on the medication tubes.   If your medication is too expensive, please contact our office at [phone] option 4 or send us a message through MyChart.   We are unable to tell what your co-pay for medications will be in advance as this is different depending on your insurance coverage. However, we may be able to find a substitute medication at lower cost or fill out paperwork to get insurance to cover a needed medication.   If a prior authorization is required to get your medication covered by your insurance company, please allow us 1-2 business days to complete this process.  Drug prices often vary depending on where the prescription is filled and some pharmacies may offer cheaper prices.  The website www.goodrx.com contains coupons for medications through different pharmacies. The prices here do not account for what the cost may be with help from insurance (it may be cheaper with your insurance), but the website can give you the price if you did not use any insurance.  - You can print the associated coupon and take it with   your prescription to the pharmacy.  - You may also stop by our office during regular business hours and pick up a GoodRx coupon card.  - If you need your prescription sent electronically to a different pharmacy, notify our office through [facility] MyChart or by phone at [phone] option 4.     Si Usted Necesita Algo Despus de Su Visita  Tambin puede enviarnos un mensaje a travs de MyChart. Por lo general respondemos a los mensajes de MyChart en el transcurso de 1 a 2  das hbiles.  Para renovar recetas, por favor pida a su farmacia que se ponga en contacto con nuestra oficina. Nuestro nmero de fax es el [phone].  Si tiene un asunto urgente cuando la clnica est cerrada y que no puede esperar hasta el siguiente da hbil, puede llamar/localizar a su doctor(a) al nmero que aparece a continuacin.   Por favor, tenga en cuenta que aunque hacemos todo lo posible para estar disponibles para asuntos urgentes fuera del horario de oficina, no estamos disponibles las 24 horas del da, los 7 das de la semana.   Si tiene un problema urgente y no puede comunicarse con nosotros, puede optar por buscar atencin mdica  en el consultorio de su doctor(a), en una clnica privada, en un centro de atencin urgente o en una sala de emergencias.  Si tiene una emergencia mdica, por favor llame inmediatamente al 911 o vaya a la sala de emergencias.  Nmeros de bper  - Dr. Kowalski: [phone]  - Dra. Moye: [phone]  - Dra. Stewart: [phone]  En caso de inclemencias del tiempo, por favor llame a nuestra lnea principal al [phone] para una actualizacin sobre el estado de cualquier retraso o cierre.  Consejos para la medicacin en dermatologa: Por favor, guarde las cajas en las que vienen los medicamentos de uso tpico para ayudarle a seguir las instrucciones sobre dnde y cmo usarlos. Las farmacias generalmente imprimen las instrucciones del medicamento slo en las cajas y no directamente en los tubos del medicamento.   Si su medicamento es muy caro, por favor, pngase en contacto con nuestra oficina llamando al [phone] y presione la opcin 4 o envenos un mensaje a travs de MyChart.   No podemos decirle cul ser su copago por los medicamentos por adelantado ya que esto es diferente dependiendo de la cobertura de su seguro. Sin embargo, es posible que podamos encontrar un medicamento sustituto a menor costo o llenar un formulario para que el  seguro cubra el medicamento que se considera necesario.   Si se requiere una autorizacin previa para que su compaa de seguros cubra su medicamento, por favor permtanos de 1 a 2 das hbiles para completar este proceso.  Los precios de los medicamentos varan con frecuencia dependiendo del lugar de dnde se surte la receta y alguna farmacias pueden ofrecer precios ms baratos.  El sitio web www.goodrx.com tiene cupones para medicamentos de diferentes farmacias. Los precios aqu no tienen en cuenta lo que podra costar con la ayuda del seguro (puede ser ms barato con su seguro), pero el sitio web puede darle el precio si no utiliz ningn seguro.  - Puede imprimir el cupn correspondiente y llevarlo con su receta a la farmacia.  - Tambin puede pasar por nuestra oficina durante el horario de atencin regular y recoger una tarjeta de cupones de GoodRx.  - Si necesita que su receta se enve electrnicamente a una farmacia diferente, informe a nuestra oficina a travs de MyChart de [facility]   o por telfono llamando al [phone] y presione la opcin 4.  

## 2022-08-06 NOTE — Progress Notes (Unsigned)
   Follow-Up Visit   Subjective  Alyssa Summers is a 72 y.o. female who presents for the following: Suture removal at the right lateral elbow.  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  Objective  Well appearing patient in no apparent distress; mood and affect are within normal limits.  Areas Examined: Right elbow      Assessment & Plan    Encounter for Removal of Sutures - Incision site is clean, dry and intact at the right lateral elbow. - Wound cleansed, sutures removed, wound cleansed and steri strips applied.  - Discussed pathology results showing [facility],  - Scars remodel for a full year. -  patient can apply over-the-counter silicone scar cream once to twice a day to help with scar remodeling if desired. - Patient advised to call with any concerns or if they notice any new or changing lesions.  Return if symptoms worsen or fail to improve.    Marye Round, CMA,

## 2022-10-20 ENCOUNTER — Ambulatory Visit: Payer: 59 | Attending: Radiation Oncology | Admitting: Radiation Oncology

## 2024-06-01 ENCOUNTER — Other Ambulatory Visit: Payer: Self-pay | Admitting: Rheumatology

## 2024-06-01 DIAGNOSIS — R7689 Other specified abnormal immunological findings in serum: Secondary | ICD-10-CM

## 2024-06-01 DIAGNOSIS — K111 Hypertrophy of salivary gland: Secondary | ICD-10-CM

## 2024-06-09 ENCOUNTER — Ambulatory Visit
Admission: RE | Admit: 2024-06-09 | Discharge: 2024-06-09 | Disposition: A | Discharge status: Home / Self Care | Source: Ambulatory Visit | Attending: Rheumatology | Admitting: Rheumatology

## 2024-06-09 DIAGNOSIS — K111 Hypertrophy of salivary gland: Secondary | ICD-10-CM

## 2024-06-09 DIAGNOSIS — R7689 Other specified abnormal immunological findings in serum: Secondary | ICD-10-CM
